# Patient Record
Sex: Male | Born: 1990 | Race: Black or African American | Hispanic: No | Marital: Single | Smoking: Current every day smoker
Health system: Southern US, Community
[De-identification: ages and names within clinical notes are randomized; demographics above are authoritative.]

## PROBLEM LIST (undated history)

## (undated) DIAGNOSIS — G43909 Migraine, unspecified, not intractable, without status migrainosus: Secondary | ICD-10-CM

---

## 2013-08-30 ENCOUNTER — Emergency Department: Payer: Self-pay | Admitting: Emergency Medicine

## 2013-08-30 LAB — CBC WITH DIFFERENTIAL/PLATELET
BASOS PCT: 0.3 %
Basophil #: 0 10*3/uL (ref 0.0–0.1)
Eosinophil #: 0.2 10*3/uL (ref 0.0–0.7)
Eosinophil %: 1.4 %
HCT: 47.6 % (ref 40.0–52.0)
HGB: 15.5 g/dL (ref 13.0–18.0)
Lymphocyte #: 4.2 10*3/uL — ABNORMAL HIGH (ref 1.0–3.6)
Lymphocyte %: 30.2 %
MCH: 30.9 pg (ref 26.0–34.0)
MCHC: 32.6 g/dL (ref 32.0–36.0)
MCV: 95 fL (ref 80–100)
MONO ABS: 1.1 x10 3/mm — AB (ref 0.2–1.0)
Monocyte %: 8 %
Neutrophil #: 8.4 10*3/uL — ABNORMAL HIGH (ref 1.4–6.5)
Neutrophil %: 60.1 %
Platelet: 316 10*3/uL (ref 150–440)
RBC: 5.03 10*6/uL (ref 4.40–5.90)
RDW: 14.2 % (ref 11.5–14.5)
WBC: 13.9 10*3/uL — AB (ref 3.8–10.6)

## 2013-08-30 LAB — COMPREHENSIVE METABOLIC PANEL
AST: 29 U/L (ref 15–37)
Albumin: 3.7 g/dL (ref 3.4–5.0)
Alkaline Phosphatase: 87 U/L
Anion Gap: 6 — ABNORMAL LOW (ref 7–16)
BILIRUBIN TOTAL: 0.5 mg/dL (ref 0.2–1.0)
BUN: 7 mg/dL (ref 7–18)
CALCIUM: 8.9 mg/dL (ref 8.5–10.1)
Chloride: 111 mmol/L — ABNORMAL HIGH (ref 98–107)
Co2: 23 mmol/L (ref 21–32)
Creatinine: 1.02 mg/dL (ref 0.60–1.30)
EGFR (African American): >60
GLUCOSE: 87 mg/dL (ref 65–99)
Osmolality: 277 (ref 275–301)
Potassium: 3.5 mmol/L (ref 3.5–5.1)
SGPT (ALT): 42 U/L
Sodium: 140 mmol/L (ref 136–145)
TOTAL PROTEIN: 7.6 g/dL (ref 6.4–8.2)

## 2013-08-30 LAB — URINALYSIS, COMPLETE
BILIRUBIN, UR: NEGATIVE
Bacteria: NONE SEEN
Blood: NEGATIVE
Glucose,UR: NEGATIVE mg/dL (ref 0–75)
Hyaline Cast: 2
KETONE: NEGATIVE
Leukocyte Esterase: NEGATIVE
Nitrite: NEGATIVE
Ph: 5 (ref 4.5–8.0)
Protein: NEGATIVE
RBC, UR: NONE SEEN /HPF (ref 0–5)
SPECIFIC GRAVITY: 1.028 (ref 1.003–1.030)
Squamous Epithelial: NONE SEEN
WBC UR: NONE SEEN /HPF (ref 0–5)

## 2013-08-30 LAB — LIPASE, BLOOD: Lipase: 230 U/L (ref 73–393)

## 2014-09-13 ENCOUNTER — Emergency Department
Admission: EM | Admit: 2014-09-13 | Discharge: 2014-09-13 | Disposition: A | Discharge status: Home / Self Care | Payer: Self-pay | Attending: Emergency Medicine | Admitting: Emergency Medicine

## 2014-09-13 DIAGNOSIS — H6502 Acute serous otitis media, left ear: Secondary | ICD-10-CM | POA: Insufficient documentation

## 2014-09-13 MED ORDER — IBUPROFEN 800 MG PO TABS: 800.0000 mg | ORAL_TABLET | Freq: Three times a day (TID) | ORAL | Status: DC | PRN

## 2014-09-13 MED ORDER — AMOXICILLIN 500 MG PO TABS: 500.0000 mg | ORAL_TABLET | Freq: Two times a day (BID) | ORAL | Status: DC

## 2014-09-13 MED ORDER — HYDROCODONE-ACETAMINOPHEN 5-325 MG PO TABS: 1.0000 | ORAL_TABLET | ORAL | Status: DC | PRN

## 2014-09-13 NOTE — ED Notes (Signed)
Pt to ED c/o L sided earache and headache x 2 days.

## 2014-09-13 NOTE — ED Provider Notes (Signed)
[facility] Emergency Department Provider Note  ____________________________________________  Time seen: Approximately 7:17 PM  I have reviewed the triage vital signs and the nursing notes.   HISTORY  Chief Complaint Headache and Otalgia    HPI [name] is a 24 y.o. male presents with complaints of left-sided earache and headache for 2 days. Denies any trauma. Denies any fever chills nausea vomiting. Blood pressure noted to be elevated systolically.   No past medical history on file.  There are no active problems to display for this patient.   No past surgical history on file.  Current Outpatient Rx  Name  Route  Sig  Dispense  Refill  . amoxicillin (AMOXIL) 500 MG tablet   Oral   Take 1 tablet (500 mg total) by mouth 2 (two) times daily.   20 tablet   0   . HYDROcodone-acetaminophen (NORCO) 5-325 MG per tablet   Oral   Take 1-2 tablets by mouth every 4 (four) hours as needed for moderate pain.   15 tablet   0   . ibuprofen (ADVIL,MOTRIN) 800 MG tablet   Oral   Take 1 tablet (800 mg total) by mouth every 8 (eight) hours as needed.   30 tablet   0     Allergies Review of patient's allergies indicates no known allergies.  No family history on file.  Social History Social History  Substance Use Topics  . Smoking status: Not on file  . Smokeless tobacco: Not on file  . Alcohol Use: Not on file    Review of Systems Constitutional: No fever/chills Eyes: No visual changes. ENT: No sore throat. Positive for left earache and headache Cardiovascular: Denies chest pain. Respiratory: Denies shortness of breath. Gastrointestinal: No abdominal pain.  No nausea, no vomiting.  No diarrhea.  No constipation. Genitourinary: Negative for dysuria. Musculoskeletal: Negative for back pain. Skin: Negative for rash. Neurological: Positive for headaches, negative for focal weakness or numbness.  10-point ROS otherwise  negative.  ____________________________________________   PHYSICAL EXAM:  VITAL SIGNS: ED Triage Vitals  Enc Vitals Group     BP 09/13/14 1914 160/78 mmHg     Pulse Rate 09/13/14 1914 82     Resp 09/13/14 1914 20     Temp 09/13/14 1914 98.4 F (36.9 C)     Temp Source 09/13/14 1914 Oral     SpO2 09/13/14 1914 99 %     Weight 09/13/14 1914 195 lb (88.451 kg)     Height 09/13/14 1914 6' (1.829 m)     Head Cir --      Peak Flow --      Pain Score 09/13/14 1914 8     Pain Loc --      Pain Edu? --      Excl. in GC? --     Constitutional: Alert and oriented. Well appearing and in no acute distress. Eyes: Conjunctivae are normal. PERRL. EOMI. Head: Atraumatic. Positive otitis media with erythema noted to left TM. Nose: No congestion/rhinnorhea. Mouth/Throat: Mucous membranes are moist.  Oropharynx non-erythematous. Neck: No stridor.   Cardiovascular: Normal rate, regular rhythm. Grossly normal heart sounds.  Good peripheral circulation. Respiratory: Normal respiratory effort.  No retractions. Lungs CTAB. Musculoskeletal: No lower extremity tenderness nor edema.  No joint effusions. Neurologic:  Normal speech and language. No gross focal neurologic deficits are appreciated. No gait instability. Skin:  Skin is warm, dry and intact. No rash noted. Psychiatric: Mood and affect are normal. Speech and behavior  are normal.  ____________________________________________   LABS (all labs ordered are listed, but only abnormal results are displayed)  Labs Reviewed - No data to display ____________________________________________  PROCEDURES  Procedure(s) performed: None  Critical Care performed: No  ____________________________________________   INITIAL IMPRESSION / ASSESSMENT AND PLAN / ED COURSE  Pertinent labs & imaging results that were available during my care of the patient were reviewed by me and considered in my medical decision making (see chart for  details).  Acute left otitis media. Rx given for Amoxil 500 mg 3 times a day Motrin 800 mg 3 times a day and hydrocodone No. 8 for severe pain. Patient follow-up with PCP or return to the ED as needed.  Patient voices no other emergency medical complaints at this time. ____________________________________________   FINAL CLINICAL IMPRESSION(S) / ED DIAGNOSES  Final diagnoses:  None      Evangeline Dakin, PA-C 09/13/14 1938  Phineas Semen, MD 09/13/14 2142

## 2014-09-21 ENCOUNTER — Emergency Department
Admission: EM | Admit: 2014-09-21 | Discharge: 2014-09-21 | Disposition: A | Discharge status: Home / Self Care | Payer: Self-pay | Attending: Emergency Medicine | Admitting: Emergency Medicine

## 2014-09-21 ENCOUNTER — Encounter: Payer: Self-pay | Admitting: Emergency Medicine

## 2014-09-21 ENCOUNTER — Emergency Department: Payer: Self-pay

## 2014-09-21 DIAGNOSIS — Z720 Tobacco use: Secondary | ICD-10-CM | POA: Insufficient documentation

## 2014-09-21 DIAGNOSIS — G44209 Tension-type headache, unspecified, not intractable: Secondary | ICD-10-CM | POA: Insufficient documentation

## 2014-09-21 MED ORDER — BUTALBITAL-APAP-CAFFEINE 50-325-40 MG PO TABS
2.0000 | ORAL_TABLET | Freq: Once | ORAL | Status: AC
  Administered 2014-09-21: 2 via ORAL
  Filled 2014-09-21: qty 2

## 2014-09-21 MED ORDER — BUTALBITAL-APAP-CAFFEINE 50-325-40 MG PO TABS: 1.0000 | ORAL_TABLET | Freq: Four times a day (QID) | ORAL | Status: AC | PRN

## 2014-09-21 NOTE — ED Provider Notes (Signed)
[facility] Emergency Department Provider Note ____________________________________________  Time seen: Approximately 1:05 PM  I have reviewed the triage vital signs and the nursing notes.   HISTORY  Chief Complaint Headache   HPI [name] is a 24 y.o. male who presents to the emergency department for evaluation of a headache. He states the headache is been present since 09/13/2014. He was seen here that day and told that he had an ear infection. He states that the ear pain has resolved, but the headache on the left side continues.  Location: Left frontal Similar to previous headaches: No Duration: 09/13/2014 TIMING: Unable to recall the specific time of onset SEVERITY: 9/10 QUALITY: Band like CONTEXT: No known exposures, no recent illness outside of the ear infection MODIFYING FACTORS: None ASSOCIATED SYMPTOMS: None History reviewed. No pertinent past medical history.  There are no active problems to display for this patient.   History reviewed. No pertinent past surgical history.  Current Outpatient Rx  Name  Route  Sig  Dispense  Refill  . amoxicillin (AMOXIL) 500 MG tablet   Oral   Take 1 tablet (500 mg total) by mouth 2 (two) times daily.   20 tablet   0   . HYDROcodone-acetaminophen (NORCO) 5-325 MG per tablet   Oral   Take 1-2 tablets by mouth every 4 (four) hours as needed for moderate pain.   15 tablet   0   . ibuprofen (ADVIL,MOTRIN) 800 MG tablet   Oral   Take 1 tablet (800 mg total) by mouth every 8 (eight) hours as needed.   30 tablet   0   . butalbital-acetaminophen-caffeine (FIORICET) 50-325-40 MG per tablet   Oral   Take 1-2 tablets by mouth every 6 (six) hours as needed for headache.   20 tablet   0     Allergies Review of patient's allergies indicates no known allergies.  History reviewed. No pertinent family history.  Social History Social History  Substance Use Topics  . Smoking status: Current  Every Day Smoker  . Smokeless tobacco: None  . Alcohol Use: Yes    Review of Systems Constitutional: No fever/chills Eyes: No visual changes. ENT: No sore throat. Cardiovascular: Denies chest pain. Respiratory: Denies shortness of breath. Gastrointestinal: No abdominal pain.  No nausea, no vomiting.  No diarrhea.  No constipation. Genitourinary: Negative for dysuria or incontinence. Musculoskeletal: Negative for pain. Skin: Negative for rash. Neurological:Positive for headache , negative for focal weakness or numbness. No confusion or fainting. Psychiatric:No anxiety or depression  10-point ROS otherwise negative.  ____________________________________________   PHYSICAL EXAM:  VITAL SIGNS: ED Triage Vitals  Enc Vitals Group     BP 09/21/14 1209 142/86 mmHg     Pulse Rate 09/21/14 1209 60     Resp 09/21/14 1209 20     Temp 09/21/14 1209 97.9 F (36.6 C)     Temp Source 09/21/14 1209 Oral     SpO2 09/21/14 1209 99 %     Weight 09/21/14 1209 195 lb (88.451 kg)     Height 09/21/14 1209 6' (1.829 m)     Head Cir --      Peak Flow --      Pain Score 09/21/14 1210 10     Pain Loc --      Pain Edu? --      Excl. in GC? --     Constitutional: Alert and oriented. Well appearing and in no acute distress. Eyes: Conjunctivae are normal. PERRL.  EOMI. No evidence of papilledema on limited exam. Head: Atraumatic. Nose: No congestion/rhinnorhea. Mouth/Throat: Mucous membranes are moist.  Oropharynx non-erythematous. Neck: No stridor. No meningismus.   Cardiovascular: Normal rate, regular rhythm. Grossly normal heart sounds.  Good peripheral circulation. Respiratory: Normal respiratory effort.  No retractions. Lungs CTAB. Gastrointestinal: Soft and nontender. No distention. No abdominal bruits. No CVA tenderness. Musculoskeletal: No lower extremity tenderness nor edema.  No joint effusions. Neurologic:  Normal speech and language. No gross focal neurologic deficits are  appreciated. No gait instability.  Cranial nerves: 2-10 normal as tested.  Cerebellar: Normal Romberg, finger-nose-finger, normal gait. Sensorimotor: No aphasia, pronator drift, clonus, sensory loss or abnormal reflexes.  Skin:  Skin is warm, dry and intact. No rash noted. Psychiatric: Mood and affect are normal. Speech and behavior are normal. Normal thought process and cognition.  ____________________________________________   LABS (all labs ordered are listed, but only abnormal results are displayed)  Labs Reviewed - No data to display ____________________________________________  EKG   ____________________________________________  RADIOLOGY  CT negative for acute findings. ____________________________________________   PROCEDURES  Procedure(s) performed: None  Critical Care performed: No  ____________________________________________   INITIAL IMPRESSION / ASSESSMENT AND PLAN / ED COURSE  Pertinent labs & imaging results that were available during my care of the patient were reviewed by me and considered in my medical decision making (see chart for details).  Patient reports improvement after taking Fioricet.  Patient was advised to follow up with the primary care provider for symptoms that are not relieved or improved over the next 24 hours. Also advised to return to the emergency department for symptoms that change or worsen if unable to schedule an appointment. ____________________________________________   FINAL CLINICAL IMPRESSION(S) / ED DIAGNOSES  Final diagnoses:  Tension-type headache, not intractable, unspecified chronicity pattern     Chinita Pester, FNP 09/21/14 1310  Emily Filbert, MD 09/21/14 [phone]

## 2014-09-21 NOTE — ED Notes (Signed)
States he was seen last week for headache/ear pain was dx'd with ear infection placed on antibiotics and norco. States ear pain is better but conts to have pain at left temple area   No fever or n/v or vision changes

## 2014-09-21 NOTE — Discharge Instructions (Signed)
Tension Headache °A tension headache is pain, pressure, or aching felt over the front and sides of the head. Tension headaches often come after stress, feeling worried (anxiety), or feeling sad or down for a while (depressed). °HOME CARE °· Only take medicine as told by your doctor. °· Lie down in a dark, quiet room when you have a headache. °· Keep a journal to find out if certain things bring on headaches. For example, write down: °¨ What you eat and drink. °¨ How much sleep you get. °¨ Any change to your diet or medicines. °· Relax by getting a massage or doing other relaxing activities. °· Put ice or heat packs on the head and neck area as told by your doctor. °· Lessen stress. °· Sit up straight. Do not tighten (tense) your muscles. °· Quit smoking if you smoke. °· Lessen how much alcohol you drink. °· Lessen how much caffeine you drink, or stop drinking caffeine. °· Eat and exercise regularly. °· Get enough sleep. °· Avoid using too much pain medicine. °GET HELP RIGHT AWAY IF:  °· Your headache becomes really bad. °· You have a fever. °· You have a stiff neck. °· You have trouble seeing. °· Your muscles are weak, or you lose muscle control. °· You lose your balance or have trouble walking. °· You feel like you will pass out (faint), or you pass out. °· You have really bad symptoms that are different than your first symptoms. °· You have problems with the medicines given to you by your doctor. °· Your medicines do not work. °· Your headache feels different than the other headaches. °· You feel sick to your stomach (nauseous) or throw up (vomit). °MAKE SURE YOU:  °· Understand these instructions. °· Will watch your condition. °· Will get help right away if you are not doing well or get worse. °Document Released: 04/08/2009 Document Revised: 04/06/2011 Document Reviewed: 01/02/2011 °[employer]® Patient Information ©2015 [employer]. This information is not intended to replace advice given to you by your health  care provider. Make sure you discuss any questions you have with your health care provider. ° °

## 2014-09-21 NOTE — ED Notes (Signed)
Pt to ed with c/o headache since August 18th.  States he was seen for earache on that day and now the ear feels better but the headache has continued.

## 2014-11-06 ENCOUNTER — Encounter: Payer: Self-pay | Admitting: Urgent Care

## 2014-11-06 ENCOUNTER — Emergency Department
Admission: EM | Admit: 2014-11-06 | Discharge: 2014-11-06 | Disposition: A | Discharge status: Home / Self Care | Payer: Self-pay | Attending: Emergency Medicine | Admitting: Emergency Medicine

## 2014-11-06 DIAGNOSIS — R51 Headache: Secondary | ICD-10-CM

## 2014-11-06 DIAGNOSIS — G43909 Migraine, unspecified, not intractable, without status migrainosus: Secondary | ICD-10-CM | POA: Insufficient documentation

## 2014-11-06 DIAGNOSIS — Z720 Tobacco use: Secondary | ICD-10-CM | POA: Insufficient documentation

## 2014-11-06 DIAGNOSIS — R519 Headache, unspecified: Secondary | ICD-10-CM

## 2014-11-06 DIAGNOSIS — G8929 Other chronic pain: Secondary | ICD-10-CM | POA: Insufficient documentation

## 2014-11-06 HISTORY — DX: Migraine, unspecified, not intractable, without status migrainosus: G43.909

## 2014-11-06 MED ORDER — METOCLOPRAMIDE HCL 5 MG/ML IJ SOLN
10.0000 mg | Freq: Once | INTRAMUSCULAR | Status: AC
  Administered 2014-11-06: 10 mg via INTRAVENOUS
  Filled 2014-11-06: qty 2

## 2014-11-06 MED ORDER — DEXAMETHASONE SODIUM PHOSPHATE 10 MG/ML IJ SOLN
10.0000 mg | Freq: Once | INTRAMUSCULAR | Status: AC
  Administered 2014-11-06: 10 mg via INTRAVENOUS
  Filled 2014-11-06: qty 1

## 2014-11-06 MED ORDER — DIPHENHYDRAMINE HCL 50 MG/ML IJ SOLN
25.0000 mg | Freq: Once | INTRAMUSCULAR | Status: AC
  Administered 2014-11-06: 25 mg via INTRAVENOUS
  Filled 2014-11-06: qty 1

## 2014-11-06 MED ORDER — KETOROLAC TROMETHAMINE 30 MG/ML IJ SOLN
30.0000 mg | Freq: Once | INTRAMUSCULAR | Status: AC
  Administered 2014-11-06: 30 mg via INTRAVENOUS
  Filled 2014-11-06: qty 1

## 2014-11-06 MED ORDER — MAGNESIUM SULFATE 2 GM/50ML IV SOLN
2.0000 g | Freq: Once | INTRAVENOUS | Status: AC
  Administered 2014-11-06: 2 g via INTRAVENOUS
  Filled 2014-11-06: qty 50

## 2014-11-06 MED ORDER — SODIUM CHLORIDE 0.9 % IV BOLUS (SEPSIS)
1000.0000 mL | INTRAVENOUS | Status: AC
  Administered 2014-11-06: 1000 mL via INTRAVENOUS

## 2014-11-06 NOTE — ED Notes (Signed)
Patient presents with c/o a headache to his LEFT temple area x 2 months. Patient advising that he has been here before for the same. Denies N/V and nuchal rigidity. Has been using Rx'd meds and IBU without relief. Reports issues with sleep secondary to pain.

## 2014-11-06 NOTE — Discharge Instructions (Signed)
You have been seen in the Emergency Department (ED) for a headache.  Please use Tylenol or Motrin as needed for symptoms, but only as written on the box.  ° °As we have discussed, please follow up with your primary care doctor as soon as possible regarding today’s Emergency Department (ED) visit and your headache symptoms.   ° °Call your doctor or return to the ED if you have a worsening headache, sudden and severe headache, confusion, slurred speech, facial droop, weakness or numbness in any arm or leg, extreme fatigue, vision problems, or other symptoms that concern you. ° ° °General Headache Without Cause °A headache is pain or discomfort felt around the head or neck area. The specific cause of a headache may not be found. There are many causes and types of headaches. A few common ones are: °· Tension headaches. °· Migraine headaches. °· Cluster headaches. °· Chronic daily headaches. °HOME CARE INSTRUCTIONS  °Watch your condition for any changes. Take these steps to help with your condition: °Managing Pain °· Take over-the-counter and prescription medicines only as told by your health care provider. °· Lie down in a dark, quiet room when you have a headache. °· If directed, apply ice to the head and neck area: °¨ Put ice in a plastic bag. °¨ Place a towel between your skin and the bag. °¨ Leave the ice on for 20 minutes, 2-3 times per day. °· Use a heating pad or hot shower to apply heat to the head and neck area as told by your health care provider. °· Keep lights dim if bright lights bother you or make your headaches worse. °Eating and Drinking °· Eat meals on a regular schedule. °· Limit alcohol use. °· Decrease the amount of caffeine you drink, or stop drinking caffeine. °General Instructions °· Keep all follow-up visits as told by your health care provider. This is important. °· Keep a headache journal to help find out what may trigger your headaches. For example, write down: °¨ What you eat and  drink. °¨ How much sleep you get. °¨ Any change to your diet or medicines. °· Try massage or other relaxation techniques. °· Limit stress. °· Sit up straight, and do not tense your muscles. °· Do not use tobacco products, including cigarettes, chewing tobacco, or e-cigarettes. If you need help quitting, ask your health care provider. °· Exercise regularly as told by your health care provider. °· Sleep on a regular schedule. Get 7-9 hours of sleep, or the amount recommended by your health care provider. °SEEK MEDICAL CARE IF:  °· Your symptoms are not helped by medicine. °· You have a headache that is different from the usual headache. °· You have nausea or you vomit. °· You have a fever. °SEEK IMMEDIATE MEDICAL CARE IF:  °· Your headache becomes severe. °· You have repeated vomiting. °· You have a stiff neck. °· You have a loss of vision. °· You have problems with speech. °· You have pain in the eye or ear. °· You have muscular weakness or loss of muscle control. °· You lose your balance or have trouble walking. °· You feel faint or pass out. °· You have confusion. °  °This information is not intended to replace advice given to you by your health care provider. Make sure you discuss any questions you have with your health care provider. °  °Document Released: 01/12/2005 Document Revised: 10/03/2014 Document Reviewed: 05/07/2014 °Elsevier Interactive Patient Education ©2016 [employer] ° °

## 2014-11-06 NOTE — ED Provider Notes (Signed)
[facility] Emergency Department Provider Note  ____________________________________________  Time seen: Approximately 9:14 PM  I have reviewed the triage vital signs and the nursing notes.   HISTORY  Chief Complaint Migraine    HPI [name] is a 24 y.o. male with no significant past medical history except for chronic migraines who presents with a migraine 2 months.  He states that it is worse in the left side of his head.  It comes and goes but always returns.  Nothing makes it better and nothing makes it worse.  He denies nausea and vomiting.  He and his wife state that he does not snore and he is not obese which would suggest sleep apnea.  He does state that sometimes the headache is worse during the night.  He was seen about 2 months ago in the emergency department and discharged with Fioricet after reassuring examination and normal CT scan.  Nothing is changed during that time and he has not followed up with any outpatient doctors.  He denies fever/chills, vision changes, chest pain, shortness of breath, abdominal pain.he states the symptoms range in intensity for mild to severe.  They are currently moderate.   Past Medical History  Diagnosis Date  . Migraine     There are no active problems to display for this patient.   History reviewed. No pertinent past surgical history.  Current Outpatient Rx  Name  Route  Sig  Dispense  Refill  . butalbital-acetaminophen-caffeine (FIORICET) 50-325-40 MG per tablet   Oral   Take 1-2 tablets by mouth every 6 (six) hours as needed for headache.   20 tablet   0   . ibuprofen (ADVIL,MOTRIN) 800 MG tablet   Oral   Take 1 tablet (800 mg total) by mouth every 8 (eight) hours as needed.   30 tablet   0     Allergies Review of patient's allergies indicates no known allergies.  No family history on file.  Social History Social History  Substance Use Topics  . Smoking status: Current Every Day  Smoker  . Smokeless tobacco: None  . Alcohol Use: Yes    Review of Systems Constitutional: No fever/chills Eyes: No visual changes. ENT: No sore throat. Cardiovascular: Denies chest pain. Respiratory: Denies shortness of breath. Gastrointestinal: No abdominal pain.  No nausea, no vomiting.  No diarrhea.  No constipation. Genitourinary: Negative for dysuria. Musculoskeletal: Negative for back pain. Skin: Negative for rash. Neurological: intermittent, waxing and waning but persistent headache 2 months.  10-point ROS otherwise negative.  ____________________________________________   PHYSICAL EXAM:  VITAL SIGNS: ED Triage Vitals  Enc Vitals Group     BP 11/06/14 2003 151/87 mmHg     Pulse Rate 11/06/14 2003 82     Resp 11/06/14 2003 18     Temp 11/06/14 2003 98.8 F (37.1 C)     Temp Source 11/06/14 2003 Oral     SpO2 11/06/14 2003 99 %     Weight 11/06/14 2003 205 lb (92.987 kg)     Height 11/06/14 2003 6' (1.829 m)     Head Cir --      Peak Flow --      Pain Score 11/06/14 2003 8     Pain Loc --      Pain Edu? --      Excl. in GC? --     Constitutional: Alert and oriented. Well appearing and in no acute distress. Eyes: Conjunctivae are normal. PERRL. EOMI. funduscopic exam is  normal with no papilledema Head: Atraumatic. Nose: No congestion/rhinnorhea. Mouth/Throat: Mucous membranes are moist.  Oropharynx non-erythematous. Neck: No stridor.  No meningismus. Cardiovascular: Normal rate, regular rhythm. Grossly normal heart sounds.  Good peripheral circulation. Respiratory: Normal respiratory effort.  No retractions. Lungs CTAB. Gastrointestinal: Soft and nontender. No distention. No abdominal bruits. No CVA tenderness. Musculoskeletal: No lower extremity tenderness nor edema.  No joint effusions. Neurologic:  Normal speech and language. No gross focal neurologic deficits are appreciated.  Skin:  Skin is warm, dry and intact. No rash noted. Psychiatric: Mood and  affect are normal. Speech and behavior are normal.  ____________________________________________   LABS (all labs ordered are listed, but only abnormal results are displayed)  Labs Reviewed - No data to display ____________________________________________  EKG  Not indicated ____________________________________________  RADIOLOGY   No results found.  ____________________________________________   PROCEDURES  Procedure(s) performed: None  Critical Care performed: No ____________________________________________   INITIAL IMPRESSION / ASSESSMENT AND PLAN / ED COURSE  Pertinent labs & imaging results that were available during my care of the patient were reviewed by me and considered in my medical decision making (see chart for details).  The patient is well-appearing and in no acute distress with normal vital signs.  His headache symptoms are not consistent with any specific etiology.  I discussed with him the need for outpatient follow-up.  There is no indication for additional imaging at this time.  I will treat him empirically as a migraine to see if his symptoms improve.  I do not believe he has any emergent medical condition at this time.  ----------------------------------------- 10:59 PM on 11/06/2014 -----------------------------------------  The patient is sleeping comfortably.  His headache improved prior to going to sleep according to his girlfriend.  I will discharge him for outpatient follow-up.  I discussed this plan with his girlfriend.  ____________________________________________  FINAL CLINICAL IMPRESSION(S) / ED DIAGNOSES  Final diagnoses:  Chronic nonintractable headache, unspecified headache type      NEW MEDICATIONS STARTED DURING THIS VISIT:  New Prescriptions   No medications on file     Loleta Rose, MD 11/06/14 2259

## 2014-12-04 ENCOUNTER — Emergency Department
Admission: EM | Admit: 2014-12-04 | Discharge: 2014-12-04 | Discharge status: Against Medical Advice | Payer: Self-pay | Attending: Emergency Medicine | Admitting: Emergency Medicine

## 2014-12-04 DIAGNOSIS — R51 Headache: Secondary | ICD-10-CM | POA: Insufficient documentation

## 2014-12-04 NOTE — ED Notes (Signed)
Patient with headache that started yesterday.  Patient verbalized that he has been here multiple times for migraines. Patient is alert and oriented, with no neurological deficit.

## 2014-12-08 ENCOUNTER — Encounter: Payer: Self-pay | Admitting: Emergency Medicine

## 2014-12-08 ENCOUNTER — Emergency Department
Admission: EM | Admit: 2014-12-08 | Discharge: 2014-12-08 | Disposition: A | Discharge status: Home / Self Care | Payer: Self-pay | Attending: Emergency Medicine | Admitting: Emergency Medicine

## 2014-12-08 DIAGNOSIS — Z87891 Personal history of nicotine dependence: Secondary | ICD-10-CM | POA: Insufficient documentation

## 2014-12-08 DIAGNOSIS — G8929 Other chronic pain: Secondary | ICD-10-CM | POA: Insufficient documentation

## 2014-12-08 DIAGNOSIS — R51 Headache: Secondary | ICD-10-CM | POA: Insufficient documentation

## 2014-12-08 MED ORDER — BUTALBITAL-APAP-CAFFEINE 50-325-40 MG PO TABS
2.0000 | ORAL_TABLET | Freq: Once | ORAL | Status: AC
  Administered 2014-12-08: 2 via ORAL
  Filled 2014-12-08: qty 2

## 2014-12-08 MED ORDER — ISOMETHEPTENE-DICHLORAL-APAP 65-100-325 MG PO CAPS: 1.0000 | ORAL_CAPSULE | ORAL | Status: AC | PRN

## 2014-12-08 NOTE — ED Notes (Signed)
States history of migraines x 2 months, no head injury, states are L sided, states was seen previously and diagnosed with migraine however has not been able to follow up with neurology yet.

## 2014-12-08 NOTE — Discharge Instructions (Signed)
°  Keep your appointment with the neurologist in December.  Midrin as directed for headache one every 4 hours as needed for headache.

## 2014-12-08 NOTE — ED Provider Notes (Signed)
[facility] Emergency Department Provider Note  ____________________________________________  Time seen: Approximately 12:42 PM  I have reviewed the triage vital signs and the nursing notes.   HISTORY  Chief Complaint Headache   HPI [name] is a 24 y.o. male is here with complaint of left-sided headache. Patient states that he has had headaches which was diagnosed as "migraines" for the last 2 months. Patient denies any head injury. He states he was seen here some months ago and had a CT scan. He does have an appointment with a neurologist in December but today presents with headache and nothing at home to take for it. He denies any nausea or vomiting. He denies any photophobia or visual disturbances. He states this is his normal type headache and nothing is changed. Currently he states his pain is 7 out of 10 and headache is constant and non-throbbing and nonradiating.   Past Medical History  Diagnosis Date  . Migraine     There are no active problems to display for this patient.   History reviewed. No pertinent past surgical history.  Current Outpatient Rx  Name  Route  Sig  Dispense  Refill  . butalbital-acetaminophen-caffeine (FIORICET) 50-325-40 MG per tablet   Oral   Take 1-2 tablets by mouth every 6 (six) hours as needed for headache.   20 tablet   0   . ibuprofen (ADVIL,MOTRIN) 800 MG tablet   Oral   Take 1 tablet (800 mg total) by mouth every 8 (eight) hours as needed.   30 tablet   0   . isometheptene-acetaminophen-dichloralphenazone (MIDRIN) 65-100-325 MG capsule   Oral   Take 1 capsule by mouth every 4 (four) hours as needed for migraine. Maximum 5 capsules in 12 hours for migraine headaches, 8 capsules in 24 hours for tension headaches.   30 capsule   2     Allergies Review of patient's allergies indicates no known allergies.  No family history on file.  Social History Social History  Substance Use Topics  .  Smoking status: Former Smoker    Types: Cigarettes  . Smokeless tobacco: None  . Alcohol Use: Yes    Review of Systems Constitutional: No fever/chills Eyes: No visual changes. ENT: No sore throat. Cardiovascular: Denies chest pain. Respiratory: Denies shortness of breath. Gastrointestinal:   No nausea, no vomiting.  Genitourinary: Negative for dysuria. Musculoskeletal: Negative for back pain. Skin: Negative for rash. Neurological: Positive for headaches, no focal weakness or numbness.  10-point ROS otherwise negative.  ____________________________________________   PHYSICAL EXAM:  VITAL SIGNS: ED Triage Vitals  Enc Vitals Group     BP 12/08/14 1212 136/101 mmHg     Pulse Rate 12/08/14 1212 100     Resp 12/08/14 1212 18     Temp 12/08/14 1212 97.4 F (36.3 C)     Temp Source 12/08/14 1212 Oral     SpO2 12/08/14 1212 99 %     Weight 12/08/14 1212 200 lb (90.719 kg)     Height 12/08/14 1212 6' (1.829 m)     Head Cir --      Peak Flow --      Pain Score 12/08/14 1213 7     Pain Loc --      Pain Edu? --      Excl. in GC? --     Constitutional: Alert and oriented. Well appearing and in no acute distress. No photophobia and patient is sitting in the room with a family member.  Eyes: Conjunctivae are normal. PERRL. EOMI. Head: Atraumatic. Nose: No congestion/rhinnorhea. Neck: No stridor.  Supple Hematological/Lymphatic/Immunilogical: No cervical lymphadenopathy. Cardiovascular: Normal rate, regular rhythm. Grossly normal heart sounds.  Good peripheral circulation. Respiratory: Normal respiratory effort.  No retractions. Lungs CTAB. Gastrointestinal: Soft and nontender. No distention.  Musculoskeletal: No lower extremity tenderness nor edema.  No joint effusions. Neurologic:  Normal speech and language. No gross focal neurologic deficits are appreciated. Normal gait without instability. Radial nerves II through XII grossly intact. Skin:  Skin is warm, dry and intact. No  rash noted. Psychiatric: Mood and affect are normal. Speech and behavior are normal.  ____________________________________________   LABS (all labs ordered are listed, but only abnormal results are displayed)  Labs Reviewed - No data to display  PROCEDURES  Procedure(s) performed: None  Critical Care performed: No  ____________________________________________   INITIAL IMPRESSION / ASSESSMENT AND PLAN / ED COURSE  Pertinent labs & imaging results that were available during my care of the patient were reviewed by me and considered in my medical decision making (see chart for details).  CT from August of this year was reviewed. This was discussed with patient. Patient was given a prescription for Fioricet as pharmacist called and states that Midrin is extremely expensive. Patient is encouraged to keep his appointment with the neurologist in December. On exam today and history taking there appears to be no change in his headache pattern therefore CT of the head was not performed today. ____________________________________________   FINAL CLINICAL IMPRESSION(S) / ED DIAGNOSES  Final diagnoses:  Chronic left-sided headaches      Tommi Rumps, PA-C 12/08/14 1601  Tommi Rumps, PA-C 12/08/14 1602  Darien Ramus, MD 12/09/14 [phone]

## 2014-12-08 NOTE — ED Notes (Signed)
Pt denies visual changes or sensitivity to light. Denies numbness or tingling. Denies nausea and vomiting.

## 2016-01-25 ENCOUNTER — Emergency Department: Payer: Self-pay

## 2016-01-25 ENCOUNTER — Emergency Department
Admission: EM | Admit: 2016-01-25 | Discharge: 2016-01-25 | Disposition: A | Discharge status: Home / Self Care | Payer: Self-pay | Attending: Emergency Medicine | Admitting: Emergency Medicine

## 2016-01-25 ENCOUNTER — Encounter: Payer: Self-pay | Admitting: *Deleted

## 2016-01-25 DIAGNOSIS — Z87891 Personal history of nicotine dependence: Secondary | ICD-10-CM | POA: Insufficient documentation

## 2016-01-25 DIAGNOSIS — R52 Pain, unspecified: Secondary | ICD-10-CM

## 2016-01-25 DIAGNOSIS — Y929 Unspecified place or not applicable: Secondary | ICD-10-CM | POA: Insufficient documentation

## 2016-01-25 DIAGNOSIS — S52135A Nondisplaced fracture of neck of left radius, initial encounter for closed fracture: Secondary | ICD-10-CM | POA: Insufficient documentation

## 2016-01-25 DIAGNOSIS — Y999 Unspecified external cause status: Secondary | ICD-10-CM | POA: Insufficient documentation

## 2016-01-25 DIAGNOSIS — Y9389 Activity, other specified: Secondary | ICD-10-CM | POA: Insufficient documentation

## 2016-01-25 MED ORDER — TRAMADOL HCL 50 MG PO TABS
50.0000 mg | ORAL_TABLET | Freq: Once | ORAL | Status: AC
  Administered 2016-01-25: 50 mg via ORAL
  Filled 2016-01-25: qty 1

## 2016-01-25 MED ORDER — MELOXICAM 15 MG PO TABS: 15.0000 mg | ORAL_TABLET | Freq: Every day | ORAL | 0 refills | Status: AC
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
## 2016-01-30 ENCOUNTER — Encounter: Payer: Self-pay | Admitting: Intensive Care

## 2016-01-30 ENCOUNTER — Emergency Department
Admission: EM | Admit: 2016-01-30 | Discharge: 2016-01-31 | Disposition: A | Discharge status: Home / Self Care | Payer: Self-pay | Attending: Emergency Medicine | Admitting: Emergency Medicine

## 2016-01-30 DIAGNOSIS — K529 Noninfective gastroenteritis and colitis, unspecified: Secondary | ICD-10-CM | POA: Insufficient documentation

## 2016-01-30 DIAGNOSIS — F17210 Nicotine dependence, cigarettes, uncomplicated: Secondary | ICD-10-CM | POA: Insufficient documentation

## 2016-01-30 DIAGNOSIS — Z79899 Other long term (current) drug therapy: Secondary | ICD-10-CM | POA: Insufficient documentation

## 2016-01-30 DIAGNOSIS — Z791 Long term (current) use of non-steroidal anti-inflammatories (NSAID): Secondary | ICD-10-CM | POA: Insufficient documentation

## 2016-01-30 LAB — COMPREHENSIVE METABOLIC PANEL
ALBUMIN: 4.8 g/dL (ref 3.5–5.0)
ALT: 28 U/L (ref 17–63)
AST: 25 U/L (ref 15–41)
Alkaline Phosphatase: 63 U/L (ref 38–126)
Anion gap: 11 (ref 5–15)
BUN: 13 mg/dL (ref 6–20)
CHLORIDE: 101 mmol/L (ref 101–111)
CO2: 27 mmol/L (ref 22–32)
CREATININE: 1 mg/dL (ref 0.61–1.24)
Calcium: 10.2 mg/dL (ref 8.9–10.3)
GFR calc Af Amer: >60 mL/min (ref >60)
GFR calc non Af Amer: >60 mL/min (ref >60)
Glucose, Bld: 93 mg/dL (ref 65–99)
Potassium: 4 mmol/L (ref 3.5–5.1)
SODIUM: 139 mmol/L (ref 135–145)
Total Bilirubin: 1.4 mg/dL — ABNORMAL HIGH (ref 0.3–1.2)
Total Protein: 9.1 g/dL — ABNORMAL HIGH (ref 6.5–8.1)

## 2016-01-30 LAB — URINALYSIS, COMPLETE (UACMP) WITH MICROSCOPIC
BACTERIA UA: NONE SEEN
Bilirubin Urine: NEGATIVE
Glucose, UA: NEGATIVE mg/dL
Hgb urine dipstick: NEGATIVE
KETONES UR: 80 mg/dL — AB
Leukocytes, UA: NEGATIVE
Nitrite: NEGATIVE
PH: 5 (ref 5.0–8.0)
Protein, ur: 30 mg/dL — AB
SQUAMOUS EPITHELIAL / LPF: NONE SEEN
Specific Gravity, Urine: 1.031 — ABNORMAL HIGH (ref 1.005–1.030)

## 2016-01-30 LAB — CBC
HEMATOCRIT: 50.4 % (ref 40.0–52.0)
Hemoglobin: 17.2 g/dL (ref 13.0–18.0)
MCH: 31.9 pg (ref 26.0–34.0)
MCHC: 34.2 g/dL (ref 32.0–36.0)
MCV: 93.4 fL (ref 80.0–100.0)
PLATELETS: 335 10*3/uL (ref 150–440)
RBC: 5.4 MIL/uL (ref 4.40–5.90)
RDW: 13.7 % (ref 11.5–14.5)
WBC: 12.9 10*3/uL — ABNORMAL HIGH (ref 3.8–10.6)

## 2016-01-30 LAB — LIPASE, BLOOD: LIPASE: 25 U/L (ref 11–51)

## 2016-01-30 MED ORDER — SODIUM CHLORIDE 0.9 % IV BOLUS (SEPSIS)
1000.0000 mL | Freq: Once | INTRAVENOUS | Status: AC
  Administered 2016-01-30: 1000 mL via INTRAVENOUS

## 2016-01-30 MED ORDER — ONDANSETRON HCL 4 MG PO TABS: 4.0000 mg | ORAL_TABLET | Freq: Every day | ORAL | 0 refills | Status: DC | PRN

## 2016-01-30 MED ORDER — SODIUM CHLORIDE 0.9 % IV BOLUS (SEPSIS): 1000.0000 mL | Freq: Once | INTRAVENOUS | Status: DC

## 2016-01-30 MED ORDER — ONDANSETRON HCL 4 MG/2ML IJ SOLN
4.0000 mg | Freq: Once | INTRAMUSCULAR | Status: AC
  Administered 2016-01-30: 4 mg via INTRAVENOUS
  Filled 2016-01-30: qty 2

## 2016-01-30 NOTE — ED Provider Notes (Signed)
-----------------------------------------   11:18 PM on 01/30/2016 -----------------------------------------   Blood pressure (!) 143/90, pulse (!) 102, temperature 97.7 F (36.5 C), temperature source Oral, resp. rate 18, height 6' (1.829 m), weight 93 kg, SpO2 100 %.  Assuming care from Dr. Alphonzo LemmingsMcShane.  In short, [name] is a 26 y.o. male with a chief complaint of Abdominal Pain .  Refer to the original H&P for additional details.  The current plan of care is to reassess abdominal discomfort after fluids and discharge if appropriate.   ----------------------------------------- 11:52 PM on 01/30/2016 -----------------------------------------  I reassessed the patient.  He is comfortable, laughing and joking with me, in no acute distress.  His vital signs have stabilized with no tachycardia at rest.  He states that his stomach feels better although he still has the occasional cramping pain.  I palpated his abdomen and he has no tenderness to palpation, no guarding, no rebound.  I had my usual and customary discussion about viral gastroenteritis and provided return precautions.  He understands and agrees with the plan.     Loleta Roseory Jennesis Ramaswamy, MD 01/30/16 [phone]

## 2016-01-30 NOTE — ED Provider Notes (Addendum)
[facility] Emergency Department Provider Note  ____________________________________________   I have reviewed the triage vital signs and the nursing notes.   HISTORY  Chief Complaint Abdominal Pain    HPI [name] is a 26 y.o. male who presents today complaining of vomiting and somewhat loose stools. Has had symptoms for 3 days. Denies fever or chills. Denies hematemesis bright blood per rectum or melena. He states that he has minimal discomfort but only when he vomits denies any focal abdominal pain, denies any recent travel.     Past Medical History:  Diagnosis Date  . Migraine     There are no active problems to display for this patient.   History reviewed. No pertinent surgical history.  Prior to Admission medications   Medication Sig Start Date End Date Taking? Authorizing Provider  ibuprofen (ADVIL,MOTRIN) 800 MG tablet Take 1 tablet (800 mg total) by mouth every 8 (eight) hours as needed. 09/13/14   Evangeline Dakinharles M Beers, PA-C  isometheptene-acetaminophen-dichloralphenazone (MIDRIN) [id number] MG capsule Take 1 capsule by mouth every 4 (four) hours as needed for migraine. Maximum 5 capsules in 12 hours for migraine headaches, 8 capsules in 24 hours for tension headaches. 12/08/14 12/08/15  Tommi Rumpshonda L Summers, PA-C  meloxicam (MOBIC) 15 MG tablet Take 1 tablet (15 mg total) by mouth daily. 01/25/16 02/04/16  Enid DerryAshley Wagner, PA-C    Allergies Patient has no known allergies.  History reviewed. No pertinent family history.  Social History Social History  Substance Use Topics  . Smoking status: Current Every Day Smoker    Types: Cigarettes  . Smokeless tobacco: Never Used  . Alcohol use No     Comment: rarely    Review of Systems Constitutional: No fever/chills Eyes: No visual changes. ENT: No sore throat. No stiff neck no neck pain Cardiovascular: Denies chest pain. Respiratory: Denies shortness of breath. Gastrointestinal:  The history of present illness Genitourinary: Negative for dysuria. Musculoskeletal: Negative lower extremity swelling Skin: Negative for rash. Neurological: Negative for severe headaches, focal weakness or numbness. 10-point ROS otherwise negative.  ____________________________________________   PHYSICAL EXAM:  VITAL SIGNS: ED Triage Vitals  Enc Vitals Group     BP 01/30/16 1727 (!) 143/90     Pulse Rate 01/30/16 1727 (!) 102     Resp 01/30/16 1727 18     Temp 01/30/16 1727 97.7 F (36.5 C)     Temp Source 01/30/16 1727 Oral     SpO2 01/30/16 1727 100 %     Weight 01/30/16 1727 205 lb (93 kg)     Height 01/30/16 1727 6' (1.829 m)     Head Circumference --      Peak Flow --      Pain Score 01/30/16 1733 8     Pain Loc --      Pain Edu? --      Excl. in GC? --     Constitutional: Alert and oriented. Well appearing and in no acute distress. Eyes: Conjunctivae are normal. PERRL. EOMI. Head: Atraumatic. Nose: No congestion/rhinnorhea. Mouth/Throat: Mucous membranes are moist.  Oropharynx non-erythematous. Neck: No stridor.   Nontender with no meningismus Cardiovascular: Normal rate, regular rhythm. Grossly normal heart sounds.  Good peripheral circulation. Respiratory: Normal respiratory effort.  No retractions. Lungs CTAB. Abdominal: Soft and nontender. No distention. No guarding no rebound Back:  There is no focal tenderness or step off.  there is no midline tenderness there are no lesions noted. there is no CVA tenderness  Musculoskeletal: No lower extremity tenderness, no upper extremity tenderness. No joint effusions, no DVT signs strong distal pulses no edema Neurologic:  Normal speech and language. No gross focal neurologic deficits are appreciated.  Skin:  Skin is warm, dry and intact. No rash noted. Psychiatric: Mood and affect are normal. Speech and behavior are normal.  ____________________________________________   LABS (all labs ordered are listed, but  only abnormal results are displayed)  Labs Reviewed  COMPREHENSIVE METABOLIC PANEL - Abnormal; Notable for the following:       Result Value   Total Protein 9.1 (*)    Total Bilirubin 1.4 (*)    All other components within normal limits  CBC - Abnormal; Notable for the following:    WBC 12.9 (*)    All other components within normal limits  URINALYSIS, COMPLETE (UACMP) WITH MICROSCOPIC - Abnormal; Notable for the following:    Color, Urine AMBER (*)    APPearance CLEAR (*)    Specific Gravity, Urine 1.031 (*)    Ketones, ur 80 (*)    Protein, ur 30 (*)    All other components within normal limits  LIPASE, BLOOD   ____________________________________________  EKG  I personally interpreted any EKGs ordered by me or triage  ____________________________________________  RADIOLOGY  I reviewed any imaging ordered by me or triage that were performed during my shift and, if possible, patient and/or family made aware of any abnormal findings. ____________________________________________   PROCEDURES  Procedure(s) performed: None  Procedures  Critical Care performed: None  ____________________________________________   INITIAL IMPRESSION / ASSESSMENT AND PLAN / ED COURSE  Pertinent labs & imaging results that were available during my care of the patient were reviewed by me and considered in my medical decision making (see chart for details).  Very well-appearing male with vomiting and Loose stools. Abdomen is benign. Blood work is reassuring. Does appear mildly dehydrated. We'll give him IV fluids and antiemetics and hopefully we can get him feeling better. Very large community burden of similar pathology with a norovirus predominance   ----------------------------------------- 11:08 PM on 01/30/2016 -----------------------------------------  Patient beginning to tolerate by mouth on his own in the room. Giving IV fluid. Feeling much better. Abdomen benign. We'll sign  out at the end of my shift to dr. York Cerise. Of note, when I went to check on the patient he was drinking water and his significant other was lying in the bed and he was propped up upon her,  neither of them seem to be in any significant distress and he states he feels much better.    Clinical Course    ____________________________________________   FINAL CLINICAL IMPRESSION(S) / ED DIAGNOSES  Final diagnoses:  None      This chart was dictated using voice recognition software.  Despite best efforts to proofread,  errors can occur which can change meaning.      Jeanmarie Plant, MD 01/30/16 2229    Jeanmarie Plant, MD 01/30/16 2257    Jeanmarie Plant, MD 01/30/16 1610    Jeanmarie Plant, MD 01/30/16 9604    Jeanmarie Plant, MD 01/30/16 [phone]

## 2016-01-30 NOTE — ED Notes (Addendum)
Pt states he has had n/v with no diarrhea for the last several days.  At this point it is dry heaves because he is unable to keep anything down he eats.  Patient states he has self treated his pain with BC powder at home but has not taken anything specifically for nausea.

## 2016-01-30 NOTE — Discharge Instructions (Addendum)
We believe your symptoms are caused by either a viral infection.  Your symptoms have improved, and we feel it is safe for you to go home and follow up with your regular doctor.  Please read the included information and stick to a bland diet for the next two days.  Drink plenty of clear fluids, and if you were provided with a prescription, please take it according to the label instructions.    If you develop any new or worsening symptoms, including persistent vomiting not controlled with medication, fever greater than 101, severe or worsening abdominal pain, or other symptoms that concern you, please return immediately to the Emergency Department.
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
## 2016-01-31 MED ORDER — IBUPROFEN 600 MG PO TABS
600.0000 mg | ORAL_TABLET | Freq: Once | ORAL | Status: AC
  Administered 2016-01-31: 600 mg via ORAL
  Filled 2016-01-31: qty 1

## 2016-01-31 NOTE — ED Notes (Signed)
Pt given ginger ale, will continue to monitor pt.

## 2017-06-23 ENCOUNTER — Emergency Department: Payer: Self-pay

## 2017-06-23 ENCOUNTER — Emergency Department
Admission: EM | Admit: 2017-06-23 | Discharge: 2017-06-23 | Disposition: A | Discharge status: Home / Self Care | Payer: Self-pay | Attending: Emergency Medicine | Admitting: Emergency Medicine

## 2017-06-23 ENCOUNTER — Other Ambulatory Visit: Payer: Self-pay

## 2017-06-23 ENCOUNTER — Encounter: Payer: Self-pay | Admitting: Emergency Medicine

## 2017-06-23 DIAGNOSIS — F17210 Nicotine dependence, cigarettes, uncomplicated: Secondary | ICD-10-CM | POA: Insufficient documentation

## 2017-06-23 DIAGNOSIS — R0789 Other chest pain: Secondary | ICD-10-CM | POA: Insufficient documentation

## 2017-06-23 LAB — CBC
HCT: 44.9 % (ref 40.0–52.0)
Hemoglobin: 15.3 g/dL (ref 13.0–18.0)
MCH: 32.7 pg (ref 26.0–34.0)
MCHC: 34 g/dL (ref 32.0–36.0)
MCV: 96.3 fL (ref 80.0–100.0)
Platelets: 276 K/uL (ref 150–440)
RBC: 4.66 MIL/uL (ref 4.40–5.90)
RDW: 14.6 % — ABNORMAL HIGH (ref 11.5–14.5)
WBC: 8.5 K/uL (ref 3.8–10.6)

## 2017-06-23 LAB — BASIC METABOLIC PANEL WITH GFR
Anion gap: 5 (ref 5–15)
BUN: 16 mg/dL (ref 6–20)
CO2: 24 mmol/L (ref 22–32)
Calcium: 9.1 mg/dL (ref 8.9–10.3)
Chloride: 108 mmol/L (ref 101–111)
Creatinine, Ser: 1.13 mg/dL (ref 0.61–1.24)
GFR calc Af Amer: >60 mL/min
GFR calc non Af Amer: >60 mL/min
Glucose, Bld: 92 mg/dL (ref 65–99)
Potassium: 4.2 mmol/L (ref 3.5–5.1)
Sodium: 137 mmol/L (ref 135–145)

## 2017-06-23 LAB — TROPONIN I: Troponin I: <0.03 ng/mL

## 2017-06-23 NOTE — Discharge Instructions (Addendum)
Your chest xray, blood tests, and EKG were normal today. Take ibuprofen as needed and avoid strenuous activity until your symptoms improve.

## 2017-06-23 NOTE — ED Notes (Signed)
Pt reports he was doing some heavy lifting and about a day later began to feel pain to the rt side of his chest. Pain with movement and palpation. Denies sob. No acute distress noted. Speaking in complete sentences without difficulty.

## 2017-06-23 NOTE — ED Triage Notes (Signed)
Pt arrived via POV with reports of right sided chest pain that started 5 days ago, pt states the pain was worse this morning while at rest.   Pt describes the pain as a sharp pain with movement and with a deep breath. Pain is 8/10 on the pain scale.  Pt also reports some back pain with the chest pain.

## 2017-06-23 NOTE — ED Provider Notes (Signed)
[facility] Emergency Department Provider Note  ____________________________________________  Time seen: Approximately 10:58 AM  I have reviewed the triage vital signs and the nursing notes.   HISTORY  Chief Complaint Chest Pain    HPI [name] is a 27 y.o. male with no significant past medical history who complains of pain in the right side of his chest that started after doing heavy lifting. Is been going on for 2 days, intermittent, worse with movement and change in position. Worse with palpating his right pectoralis muscle. No alleviating factors. Achy, moderate intensity. Nonradiating.      Past Medical History:  Diagnosis Date  . Migraine      There are no active problems to display for this patient.    History reviewed. No pertinent surgical history.   Prior to Admission medications   Medication Sig Start Date End Date Taking? Authorizing Provider  ibuprofen (ADVIL,MOTRIN) 800 MG tablet Take 1 tablet (800 mg total) by mouth every 8 (eight) hours as needed. 09/13/14   Beers, Charmayne Sheer, PA-C  isometheptene-acetaminophen-dichloralphenazone (MIDRIN) [phone] MG capsule Take 1 capsule by mouth every 4 (four) hours as needed for migraine. Maximum 5 capsules in 12 hours for migraine headaches, 8 capsules in 24 hours for tension headaches. 12/08/14 12/08/15  Tommi Rumps, PA-C  ondansetron (ZOFRAN) 4 MG tablet Take 1 tablet (4 mg total) by mouth daily as needed for nausea or vomiting. 01/30/16   Jeanmarie Plant, MD     Allergies Patient has no known allergies.   History reviewed. No pertinent family history.  Social History Social History   Tobacco Use  . Smoking status: Current Every Day Smoker    Packs/day: 0.25    Types: Cigarettes  . Smokeless tobacco: Never Used  Substance Use Topics  . Alcohol use: No    Comment: rarely  . Drug use: Yes    Types: Marijuana    Review of Systems  Constitutional:   No fever or  chills.   Cardiovascular:  positive as above chest pain without syncope. Respiratory:   No dyspnea or cough. Gastrointestinal:   Negative for abdominal pain, vomiting and diarrhea.  Musculoskeletal:   Negative for focal pain or swelling All other systems reviewed and are negative except as documented above in ROS and HPI.  ____________________________________________   PHYSICAL EXAM:  VITAL SIGNS: ED Triage Vitals  Enc Vitals Group     BP 06/23/17 0846 136/74     Pulse Rate 06/23/17 0846 74     Resp 06/23/17 0846 18     Temp 06/23/17 0846 98.4 F (36.9 C)     Temp Source 06/23/17 0846 Oral     SpO2 06/23/17 0846 100 %     Weight 06/23/17 0846 190 lb (86.2 kg)     Height 06/23/17 0846 6' (1.829 m)     Head Circumference --      Peak Flow --      Pain Score 06/23/17 0900 8     Pain Loc --      Pain Edu? --      Excl. in GC? --     Vital signs reviewed, nursing assessments reviewed.   Constitutional:   Alert and oriented. Well appearing and in no distress. Eyes:   Conjunctivae are normal. EOMI. PERRL. ENT      Head:   Normocephalic and atraumatic.      Nose:   No congestion/rhinnorhea.       Mouth/Throat:   MMM,  no pharyngeal erythema. No peritonsillar mass.       Neck:   No meningismus. Full ROM. Hematological/Lymphatic/Immunilogical:   No cervical lymphadenopathy. Cardiovascular:   RRR. Symmetric bilateral radial and DP pulses.  No murmurs.  Respiratory:   Normal respiratory effort without tachypnea/retractions. Breath sounds are clear and equal bilaterally. No wheezes/rales/rhonchi. Gastrointestinal:   Soft and nontender. Non distended. There is no CVA tenderness.  No rebound, rigidity, or guarding.  Musculoskeletal:   Normal range of motion in all extremities. No joint effusions.  No lower extremity tenderness.  No edema. chest pain reproducible with palpation of the pectoralis. No crepitus or inflammatory changes. Chest wall stable. Neurologic:   Normal speech and  language.  Motor grossly intact. No acute focal neurologic deficits are appreciated.  Skin:    Skin is warm, dry and intact. No rash noted.  No petechiae, purpura, or bullae.  ____________________________________________    LABS (pertinent positives/negatives) (all labs ordered are listed, but only abnormal results are displayed) Labs Reviewed  CBC - Abnormal; Notable for the following components:      Result Value   RDW 14.6 (*)    All other components within normal limits  BASIC METABOLIC PANEL  TROPONIN I   ____________________________________________   EKG  interpreted by me Normal sinus rhythm rate of 63, normal axis intervals QRS. Diffuse J-point elevation with otherwise normal ST segments and T waves, consistent with benign early repolarization.  ____________________________________________    RADIOLOGY  Dg Chest 2 View  Result Date: 06/23/2017 CLINICAL DATA:  Five days of mid chest pain which has gradually worsened. Current smoker. EXAM: CHEST - 2 VIEW COMPARISON:  None in PACs FINDINGS: The lungs are adequately inflated. There is mild hemidiaphragm flattening. The interstitial markings are coarse. There is no alveolar infiltrate, pleural effusion, or pneumothorax. The heart and pulmonary vascularity are normal. The mediastinum is normal in width. The trachea is midline. The bony thorax exhibits no acute abnormality. IMPRESSION: Bronchitic-related interstitial changes. No pneumonia or other acute cardiopulmonary abnormality. Electronically Signed   By: David  [place] M.D.   On: 06/23/2017 10:13    ____________________________________________   PROCEDURES Procedures  ____________________________________________    CLINICAL IMPRESSION / ASSESSMENT AND PLAN / ED COURSE  Pertinent labs & imaging results that were available during my care of the patient were reviewed by me and considered in my medical decision making (see chart for details).    patient well  appearing, no acute distress, presents with chest wall pain that is reproducible after heavy lifting.Considering the patient's symptoms, medical history, and physical examination today, I have low suspicion for ACS, PE, TAD, pneumothorax, carditis, mediastinitis, pneumonia, CHF, or sepsis.        ____________________________________________   FINAL CLINICAL IMPRESSION(S) / ED DIAGNOSES    Final diagnoses:  Chest wall pain     ED Discharge Orders    None      Portions of this note were generated with dragon dictation software. Dictation errors may occur despite best attempts at proofreading.    Sharman Cheek, MD 06/23/17 1102

## 2018-02-02 ENCOUNTER — Other Ambulatory Visit: Payer: Self-pay

## 2018-02-02 ENCOUNTER — Emergency Department
Admission: EM | Admit: 2018-02-02 | Discharge: 2018-02-02 | Disposition: A | Discharge status: Home / Self Care | Payer: Self-pay | Attending: Emergency Medicine | Admitting: Emergency Medicine

## 2018-02-02 ENCOUNTER — Encounter: Payer: Self-pay | Admitting: Emergency Medicine

## 2018-02-02 DIAGNOSIS — M545 Low back pain, unspecified: Secondary | ICD-10-CM

## 2018-02-02 DIAGNOSIS — F17210 Nicotine dependence, cigarettes, uncomplicated: Secondary | ICD-10-CM | POA: Insufficient documentation

## 2018-02-02 DIAGNOSIS — B081 Molluscum contagiosum: Secondary | ICD-10-CM | POA: Insufficient documentation

## 2018-02-02 MED ORDER — HYDROCORTISONE 1 % EX OINT: 1.0000 "application " | TOPICAL_OINTMENT | Freq: Two times a day (BID) | CUTANEOUS | 0 refills | Status: AC

## 2018-02-02 MED ORDER — NAPROXEN 500 MG PO TABS
500.0000 mg | ORAL_TABLET | Freq: Once | ORAL | Status: AC
  Administered 2018-02-02: 500 mg via ORAL
  Filled 2018-02-02: qty 1

## 2018-02-02 MED ORDER — CYCLOBENZAPRINE HCL 10 MG PO TABS: 10.0000 mg | ORAL_TABLET | Freq: Three times a day (TID) | ORAL | 0 refills | Status: DC | PRN

## 2018-02-02 MED ORDER — CYCLOBENZAPRINE HCL 10 MG PO TABS
10.0000 mg | ORAL_TABLET | Freq: Once | ORAL | Status: AC
  Administered 2018-02-02: 10 mg via ORAL
  Filled 2018-02-02: qty 1

## 2018-02-02 MED ORDER — NAPROXEN 500 MG PO TABS: 500.0000 mg | ORAL_TABLET | Freq: Two times a day (BID) | ORAL | 0 refills | Status: DC

## 2018-02-02 NOTE — Discharge Instructions (Signed)
Follow up with primary care or return to the ER for symptoms that change or worsen. 

## 2018-02-02 NOTE — ED Notes (Signed)
Medications given to patient and patient educated on how to take medication and what it is used for. Discharge paperwork reviewed and patient stated understanding and signed discharge.

## 2018-02-02 NOTE — ED Triage Notes (Signed)
Patient presents to the ED with mid-right sided back pain.  Patient states pain is dull and he had had pain for 2 weeks.  Patient states pain is worse when he sits still, but when he stands or lies down he says, "it really doesn't bother me."  Patient denies nausea and vomiting.  Patient also has a rash to his left arm x 2 weeks as well.

## 2018-02-02 NOTE — ED Notes (Signed)
Pt to the er for left side back pain. Pt has mild swelling to the right side of his back at the kidney. Muscles feel tighter in that area on the right than the left. Pt denies injury or accident. Pt denies pain anywhere els, burning with urination or hematuria. Pain is increased when sitting and pain free with standing and laying down.

## 2018-02-02 NOTE — ED Provider Notes (Signed)
[facility] Emergency Department Provider Note ____________________________________________  Time seen: Approximately 8:12 PM  I have reviewed the triage vital signs and the nursing notes.  HISTORY  Chief Complaint Back Pain   HPI SENCERE [name] is a 28 y.o. male who presents to the emergency department for treatment and evaluation of pain to the left side of his back. Symptoms started 2 weeks ago. No specific injury. No other related symptoms. He is also concerned about a rash to his forearms that has been been present for the past 2 weeks as well.   Past Medical History:  Diagnosis Date  . Migraine     There are no active problems to display for this patient.   History reviewed. No pertinent surgical history.  Prior to Admission medications   Medication Sig Start Date End Date Taking? Authorizing Provider  ibuprofen (ADVIL,MOTRIN) 800 MG tablet Take 1 tablet (800 mg total) by mouth every 8 (eight) hours as needed. 09/13/14   Beers, Charmayne Sheer, PA-C  isometheptene-acetaminophen-dichloralphenazone (MIDRIN) [phone] MG capsule Take 1 capsule by mouth every 4 (four) hours as needed for migraine. Maximum 5 capsules in 12 hours for migraine headaches, 8 capsules in 24 hours for tension headaches. 12/08/14 12/08/15  Tommi Rumps, PA-C  ondansetron (ZOFRAN) 4 MG tablet Take 1 tablet (4 mg total) by mouth daily as needed for nausea or vomiting. 01/30/16   Jeanmarie Plant, MD    Allergies Patient has no known allergies.  No family history on file.  Social History Social History   Tobacco Use  . Smoking status: Current Every Day Smoker    Packs/day: 0.25    Types: Cigarettes  . Smokeless tobacco: Never Used  Substance Use Topics  . Alcohol use: No    Comment: rarely  . Drug use: Yes    Types: Marijuana    Review of Systems Constitutional: Well appearing. Respiratory: Negative for dyspnea. Cardiovascular: Negative for change in skin  temperature or color. Musculoskeletal:   Negative for chronic steroid use   Negative for trauma in the presence of osteoporosis  Negative for age over 75 and trauma.  Negative for constitutional symptoms, or history of cancer   Negative for pain worse at night. Skin:  Positive for skin colored rash on forearms and elbows. Papular rash. Genitourinary: Negative for urinary retention. Rectal: Negative for fecal incontinence or new onset constipation/bowel habit changes. Hematological/Immunilogical: Negative for immunosuppression, IV drug use, or fever Neurological: Negative for burning, tingling, numb, electric, radiating pain in the extremities.                        Negative for saddle anesthesia.                        Negative for focal neurologic deficit, progressive or disabling symptoms             Negative for saddle anesthesia. ____________________________________________   PHYSICAL EXAM:  VITAL SIGNS: ED Triage Vitals  Enc Vitals Group     BP 02/02/18 1855 139/88     Pulse Rate 02/02/18 1855 90     Resp 02/02/18 1855 18     Temp 02/02/18 1855 98.4 F (36.9 C)     Temp Source 02/02/18 1855 Oral     SpO2 02/02/18 1855 99 %     Weight 02/02/18 1855 192 lb (87.1 kg)     Height 02/02/18 1855 6' (1.829 m)  Head Circumference --      Peak Flow --      Pain Score 02/02/18 1900 8     Pain Loc --      Pain Edu? --      Excl. in GC? --     Constitutional: Alert and oriented. Well appearing and in no acute distress. Eyes: Conjunctivae are clear without discharge or drainage.  Head: Atraumatic. Neck: Full, active range of motion. Respiratory: Respirations even and unlabored. Musculoskeletal: Full ROM of the back and extremities, Strength 5/5 of the lower extremities as tested. Tenderness with twisting movement on the right side. No focal bony tenderness of the lumbar spine Neurologic: Reflexes of the lower extremities are 2+. Negative straight leg raise on the right  side. Skin: Atraumatic.  Psychiatric: Behavior and affect are normal.  ____________________________________________   LABS (all labs ordered are listed, but only abnormal results are displayed)  Labs Reviewed - No data to display ____________________________________________  RADIOLOGY  Not indicated. ____________________________________________   PROCEDURES  Procedure(s) performed:  Procedures ____________________________________________   INITIAL IMPRESSION / ASSESSMENT AND PLAN / ED COURSE  Ronald BERNINGER is a 28 y.o. male who presents to the emergency department for treatment and evaluation of right-sided back pain started approximately 2 weeks ago.  He also reports that he had a rash started on the left upper arm which is now on the leg left forearm as well as the right elbow.  Initially the rash was pruritic, however today it is not.  No one else in the family has similar rash.  Symptoms and exam are most consistent with musculoskeletal source for the back pain.  Rash appears to be molluscum contagiosum.  Patient requests some type of cream to apply to the area and will be given a prescription for hydrocortisone.  He was advised that this would not prevent it from spreading and would not decrease the chances of someone else having a similar rash.  He will be treated with Naprosyn and Flexeril for back pain.  He is to follow-up with the primary care provider for choice for symptoms that are not improving in the next few days.  He will return to the emergency department for symptoms change or worsen if unable to schedule appointment.  Medications - No data to display  ED Discharge Orders    None       Pertinent labs & imaging results that were available during my care of the patient were reviewed by me and considered in my medical decision making (see chart for details).  _________________________________________   FINAL CLINICAL IMPRESSION(S) / ED  DIAGNOSES  Final diagnoses:  None     If controlled substance prescribed during this visit, 12 month history viewed on the [facility] prior to issuing an initial prescription for Schedule II or III opiod.    Chinita Pester, FNP 02/02/18 2149    Jeanmarie Plant, MD 02/02/18 2149

## 2018-02-07 ENCOUNTER — Emergency Department
Admission: EM | Admit: 2018-02-07 | Discharge: 2018-02-07 | Disposition: A | Discharge status: Home / Self Care | Payer: Self-pay | Attending: Student in an Organized Health Care Education/Training Program | Admitting: Student in an Organized Health Care Education/Training Program

## 2018-02-07 ENCOUNTER — Other Ambulatory Visit: Payer: Self-pay

## 2018-02-07 ENCOUNTER — Encounter: Payer: Self-pay | Admitting: Emergency Medicine

## 2018-02-07 DIAGNOSIS — F17210 Nicotine dependence, cigarettes, uncomplicated: Secondary | ICD-10-CM | POA: Insufficient documentation

## 2018-02-07 DIAGNOSIS — L03811 Cellulitis of head [any part, except face]: Secondary | ICD-10-CM | POA: Insufficient documentation

## 2018-02-07 MED ORDER — SILVER SULFADIAZINE 1 % EX CREA: TOPICAL_CREAM | CUTANEOUS | 0 refills | Status: AC

## 2018-02-07 MED ORDER — SULFAMETHOXAZOLE-TRIMETHOPRIM 800-160 MG PO TABS: 1.0000 | ORAL_TABLET | Freq: Two times a day (BID) | ORAL | 0 refills | Status: AC

## 2018-02-07 MED ORDER — SULFAMETHOXAZOLE-TRIMETHOPRIM 800-160 MG PO TABS
1.0000 | ORAL_TABLET | Freq: Once | ORAL | Status: AC
  Administered 2018-02-07: 1 via ORAL
  Filled 2018-02-07: qty 1

## 2018-02-07 MED ORDER — SILVER SULFADIAZINE 1 % EX CREA
TOPICAL_CREAM | Freq: Once | CUTANEOUS | Status: AC
  Administered 2018-02-07: 16:00:00 via TOPICAL

## 2018-02-07 NOTE — ED Triage Notes (Signed)
Notes small area draining pus since yesterday back of neck.

## 2018-02-07 NOTE — ED Provider Notes (Signed)
[facility] Emergency Department Provider Note ____________________________________________  Time seen: 1515  I have reviewed the triage vital signs and the nursing notes.  HISTORY  Chief Complaint  Abscess  HPI [name] is a 28 y.o. male presents himself to the ED for evaluation of spontaneously draining abscess to to nape. He reports the area has been draining for the last 2 days. He admits to using hydrogen peroxide to cleanse the area. He denies fevers, chills, or sweats. He has no medical history and take no daily meds.   Past Medical History:  Diagnosis Date  . Migraine     There are no active problems to display for this patient.  History reviewed. No pertinent surgical history.  Prior to Admission medications   Medication Sig Start Date End Date Taking? Authorizing Provider  cyclobenzaprine (FLEXERIL) 10 MG tablet Take 1 tablet (10 mg total) by mouth 3 (three) times daily as needed for muscle spasms. 02/02/18   Triplett, Cari B, FNP  hydrocortisone 1 % ointment Apply 1 application topically 2 (two) times daily. 02/02/18   Triplett, Rulon Eisenmenger B, FNP  isometheptene-acetaminophen-dichloralphenazone (MIDRIN) [phone] MG capsule Take 1 capsule by mouth every 4 (four) hours as needed for migraine. Maximum 5 capsules in 12 hours for migraine headaches, 8 capsules in 24 hours for tension headaches. 12/08/14 12/08/15  Tommi Rumps, PA-C  naproxen (NAPROSYN) 500 MG tablet Take 1 tablet (500 mg total) by mouth 2 (two) times daily with a meal. 02/02/18   Triplett, Cari B, FNP  silver sulfADIAZINE (SILVADENE) 1 % cream Apply to affected area twice daily 02/07/18   Dedra Matsuo, Charlesetta Ivory, PA-C  sulfamethoxazole-trimethoprim (BACTRIM DS,SEPTRA DS) 800-160 MG tablet Take 1 tablet by mouth 2 (two) times daily. 02/07/18   Jemiah Cuadra, Charlesetta Ivory, PA-C    Allergies Patient has no known allergies.  No family history on file.  Social History Social History    Tobacco Use  . Smoking status: Current Every Day Smoker    Packs/day: 0.25    Types: Cigarettes  . Smokeless tobacco: Never Used  Substance Use Topics  . Alcohol use: No    Comment: rarely  . Drug use: Yes    Types: Marijuana    Review of Systems  Constitutional: Negative for fever. Eyes: Negative for visual changes. ENT: Negative for sore throat. Cardiovascular: Negative for chest pain. Respiratory: Negative for shortness of breath. Gastrointestinal: Negative for abdominal pain, vomiting and diarrhea. Genitourinary: Negative for dysuria. Musculoskeletal: Negative for back pain. Skin: Negative for rash. Draining abscess to the nape Neurological: Negative for headaches, focal weakness or numbness. ____________________________________________  PHYSICAL EXAM:  VITAL SIGNS: ED Triage Vitals  Enc Vitals Group     BP 02/07/18 1401 (!) 160/85     Pulse Rate 02/07/18 1401 100     Resp 02/07/18 1401 18     Temp 02/07/18 1401 97.8 F (36.6 C)     Temp Source 02/07/18 1401 Oral     SpO2 02/07/18 1401 98 %     Weight 02/07/18 1402 196 lb (88.9 kg)     Height 02/07/18 1402 6' (1.829 m)     Head Circumference --      Peak Flow --      Pain Score 02/07/18 1402 7     Pain Loc --      Pain Edu? --      Excl. in GC? --     Constitutional: Alert and oriented. Well appearing and in no  distress. Head: Normocephalic and atraumatic. Eyes: Conjunctivae are normal. Normal extraocular movements Neck: Supple. No thyromegaly. Normal ROM. Hematological/Lymphatic/Immunological: No cervical lymphadenopathy. Cardiovascular: Normal rate, regular rhythm. Respiratory: Normal respiratory effort.  Musculoskeletal: Nontender with normal range of motion in all extremities.  Neurologic:  Normal gait without ataxia. Normal speech and language. No gross focal neurologic deficits are appreciated. Skin:  Skin is warm, dry and intact. No rash noted. Patient with a 1 cm diameter ulcerated area with  underlying induration. There is no purulence noted.  ____________________________________________  PROCEDURES  Procedures Bactrim DS 1 PO Silvadene cream  ____________________________________________  INITIAL IMPRESSION / ASSESSMENT AND PLAN / ED COURSE  Patient with ED evaluation of a draining abscess to the nape. He continues to note firmness and tenderness. He will be treated with Bactrim and silvadene. Wound care instructions have been provided. He is referred to one of the local community clinics for further/routine care.  ____________________________________________  FINAL CLINICAL IMPRESSION(S) / ED DIAGNOSES  Final diagnoses:  Cellulitis of head except face     Lissa Hoard, PA-C 02/07/18 1600    Willy Eddy, MD 02/08/18 1931

## 2018-02-07 NOTE — Discharge Instructions (Signed)
You are being treated for a cellulitis to the back of the neck. Keep the area clean and covered as needed. Apply the ointment twice daily. Take the antibiotic as directed. Apply warm compresses to promote healing.

## 2018-02-07 NOTE — ED Notes (Signed)
See triage note  Presents with possible small abscess area to back of neck  Area is drain some

## 2018-02-09 ENCOUNTER — Emergency Department
Admission: EM | Admit: 2018-02-09 | Discharge: 2018-02-09 | Disposition: A | Discharge status: Home / Self Care | Payer: Self-pay | Attending: Emergency Medicine | Admitting: Emergency Medicine

## 2018-02-09 ENCOUNTER — Other Ambulatory Visit: Payer: Self-pay

## 2018-02-09 DIAGNOSIS — L0291 Cutaneous abscess, unspecified: Secondary | ICD-10-CM

## 2018-02-09 DIAGNOSIS — L0211 Cutaneous abscess of neck: Secondary | ICD-10-CM | POA: Insufficient documentation

## 2018-02-09 DIAGNOSIS — F17210 Nicotine dependence, cigarettes, uncomplicated: Secondary | ICD-10-CM | POA: Insufficient documentation

## 2018-02-09 DIAGNOSIS — Z79899 Other long term (current) drug therapy: Secondary | ICD-10-CM | POA: Insufficient documentation

## 2018-02-09 MED ORDER — CEPHALEXIN 500 MG PO CAPS
500.0000 mg | ORAL_CAPSULE | Freq: Once | ORAL | Status: AC
Start: 2018-02-09 — End: 2018-02-09
  Administered 2018-02-09: 500 mg via ORAL
  Filled 2018-02-09: qty 1

## 2018-02-09 MED ORDER — CEPHALEXIN 500 MG PO CAPS: 500.0000 mg | ORAL_CAPSULE | Freq: Two times a day (BID) | ORAL | 0 refills | Status: DC

## 2018-02-09 MED ORDER — LIDOCAINE VISCOUS HCL 2 % MT SOLN
15.0000 mL | Freq: Once | OROMUCOSAL | Status: AC
  Administered 2018-02-09: 15 mL via OROMUCOSAL
  Filled 2018-02-09: qty 15

## 2018-02-09 MED ORDER — LIDOCAINE HCL (PF) 1 % IJ SOLN
INTRAMUSCULAR | Status: AC
  Administered 2018-02-09: 5 mL
  Filled 2018-02-09: qty 5

## 2018-02-09 MED ORDER — LIDOCAINE HCL (PF) 1 % IJ SOLN
30.0000 mL | Freq: Once | INTRAMUSCULAR | Status: AC
  Administered 2018-02-09: 5 mL

## 2018-02-09 MED ORDER — OXYCODONE-ACETAMINOPHEN 5-325 MG PO TABS
1.0000 | ORAL_TABLET | Freq: Once | ORAL | Status: AC
  Administered 2018-02-09: 1 via ORAL
  Filled 2018-02-09: qty 1

## 2018-02-09 NOTE — ED Triage Notes (Signed)
Pt here for wound check states wound to posterior neck x 4 days and was already seen here for the same and given silvadene. Pt states here because wound is not improving.

## 2018-02-09 NOTE — ED Provider Notes (Signed)
[facility] Emergency Department Provider Note   First MD Initiated Contact with Patient 02/09/18 [id number]     (approximate)  I have reviewed the triage vital signs and the nursing notes.   HISTORY  Chief Complaint Wound Check    HPI [name] is a 28 y.o. male returns to the emergency department after being seen in 02/07/2018 secondary to cellulitis on the nape of the neck.  Patient presents stating that area is increasing swelling discomfort and tenderness.  Patient denies any fever afebrile on presentation.  Patient was seen in the emergency department and prescribed Bactrim and Silvadene.  Patient that symptoms initially began when he noticed a bump on the nape of his neck which he "burst".  Following doing so the patient states that the area has persistently increased in size and discomfort with previous purulent drainage.   Past Medical History:  Diagnosis Date  . Migraine     There are no active problems to display for this patient.   No past surgical history on file.  Prior to Admission medications   Medication Sig Start Date End Date Taking? Authorizing Provider  cephALEXin (KEFLEX) 500 MG capsule Take 1 capsule (500 mg total) by mouth 2 (two) times daily for 10 days. 02/09/18 02/19/18  Darci CurrentBrown, Joyce N, MD  cyclobenzaprine (FLEXERIL) 10 MG tablet Take 1 tablet (10 mg total) by mouth 3 (three) times daily as needed for muscle spasms. 02/02/18   Triplett, Cari B, FNP  hydrocortisone 1 % ointment Apply 1 application topically 2 (two) times daily. 02/02/18   Triplett, Rulon Eisenmengerari B, FNP  isometheptene-acetaminophen-dichloralphenazone (MIDRIN) 203848739365-100-325 MG capsule Take 1 capsule by mouth every 4 (four) hours as needed for migraine. Maximum 5 capsules in 12 hours for migraine headaches, 8 capsules in 24 hours for tension headaches. 12/08/14 12/08/15  Tommi RumpsSummers, Rhonda L, PA-C  naproxen (NAPROSYN) 500 MG tablet Take 1 tablet (500 mg total) by mouth 2 (two) times  daily with a meal. 02/02/18   Triplett, Cari B, FNP  silver sulfADIAZINE (SILVADENE) 1 % cream Apply to affected area twice daily 02/07/18   Menshew, Charlesetta IvoryJenise V Bacon, PA-C  sulfamethoxazole-trimethoprim (BACTRIM DS,SEPTRA DS) 800-160 MG tablet Take 1 tablet by mouth 2 (two) times daily. 02/07/18   Menshew, Charlesetta IvoryJenise V Bacon, PA-C    Allergies Patient has no known allergies.  No family history on file.  Social History Social History   Tobacco Use  . Smoking status: Current Every Day Smoker    Packs/day: 0.25    Types: Cigarettes  . Smokeless tobacco: Never Used  Substance Use Topics  . Alcohol use: No    Comment: rarely  . Drug use: Yes    Types: Marijuana    Review of Systems Constitutional: No fever/chills Eyes: No visual changes. ENT: No sore throat. Cardiovascular: Denies chest pain. Respiratory: Denies shortness of breath. Gastrointestinal: No abdominal pain.  No nausea, no vomiting.  No diarrhea.  No constipation. Genitourinary: Negative for dysuria. Musculoskeletal: Negative for neck pain.  Negative for back pain. Integumentary: Positive for possible neck abscess Neurological: Negative for headaches, focal weakness or numbness.   ____________________________________________   PHYSICAL EXAM:  VITAL SIGNS: ED Triage Vitals  Enc Vitals Group     BP 02/09/18 0133 140/84     Pulse Rate 02/09/18 0133 (!) 120     Resp 02/09/18 0133 17     Temp 02/09/18 0133 97.9 F (36.6 C)     Temp Source 02/09/18 0133 Oral  SpO2 02/09/18 0133 100 %     Weight 02/09/18 0134 87.5 kg (193 lb)     Height 02/09/18 0134 1.829 m (6')     Head Circumference --      Peak Flow --      Pain Score 02/09/18 0138 8     Pain Loc --      Pain Edu? --      Excl. in GC? --     Constitutional: Alert and oriented. Well appearing and in no acute distress. Eyes: Conjunctivae are normal.  Mouth/Throat: Mucous membranes are moist.  Oropharynx non-erythematous. Neck: No stridor.     Cardiovascular: Normal rate, regular rhythm. Good peripheral circulation. Grossly normal heart sounds. Respiratory: Normal respiratory effort.  No retractions. Lungs CTAB. Gastrointestinal: Soft and nontender. No distention.  Musculoskeletal: No lower extremity tenderness nor edema. No gross deformities of extremities. Neurologic:  Normal speech and language. No gross focal neurologic deficits are appreciated.  Skin: 5 x 6 cm area of flocculence and induration noted nape of the neck with associated overlying erythema. Psychiatric: Mood and affect are normal. Speech and behavior are normal.  ____________________________________________    .Marland Kitchen.Incision and Drainage Date/Time: 02/09/2018 6:11 AM Performed by: Darci CurrentBrown, McLean N, MD Authorized by: Darci CurrentBrown, Demorest N, MD   Consent:    Consent obtained:  Verbal   Consent given by:  Patient   Risks discussed:  Bleeding, infection, incomplete drainage and pain   Alternatives discussed:  Alternative treatment, delayed treatment and observation Location:    Type:  Abscess   Location:  Neck (Midline posterior) Pre-procedure details:    Skin preparation:  Betadine Anesthesia (see MAR for exact dosages):    Anesthesia method:  Local infiltration   Local anesthetic:  Lidocaine 1% w/o epi Procedure type:    Complexity:  Complex Procedure details:    Incision types:  Cruciate   Scalpel blade:  11   Drainage:  Purulent and serosanguinous   Packing materials:  None Post-procedure details:    Patient tolerance of procedure:  Tolerated well, no immediate complications     ____________________________________________   INITIAL IMPRESSION / ASSESSMENT AND PLAN / ED COURSE  As part of my medical decision making, I reviewed the following data within the electronic MEDICAL RECORD NUMBER   28 year old male presenting with above-stated history and physical exam consistent with abscess to the nape of the neck.  I&D was performed with purulent drainage  expressed followed by serosanguineous drainage.  Patient given Percocet 5 325 mg tablet in the emergency department.  Patient also given Keflex.  Patient advised to follow-up within 48 hours for wound recheck. ____________________________________________  FINAL CLINICAL IMPRESSION(S) / ED DIAGNOSES  Final diagnoses:  Abscess     MEDICATIONS GIVEN DURING THIS VISIT:  Medications  lidocaine (XYLOCAINE) 2 % viscous mouth solution 15 mL (15 mLs Mouth/Throat Given 02/09/18 0223)  lidocaine (PF) (XYLOCAINE) 1 % injection 30 mL (5 mLs Other Given by Other 02/09/18 0223)  cephALEXin (KEFLEX) capsule 500 mg (500 mg Oral Given 02/09/18 0324)  oxyCODONE-acetaminophen (PERCOCET/ROXICET) 5-325 MG per tablet 1 tablet (1 tablet Oral Given 02/09/18 0324)     ED Discharge Orders         Ordered    cephALEXin (KEFLEX) 500 MG capsule  2 times daily     02/09/18 0319           Note:  This document was prepared using Dragon voice recognition software and may include unintentional dictation errors.    Bayard MalesBrown, [place]  Dorris Carnes, MD 02/09/18 [phone]

## 2018-02-10 ENCOUNTER — Other Ambulatory Visit: Payer: Self-pay

## 2018-02-10 ENCOUNTER — Encounter: Payer: Self-pay | Admitting: *Deleted

## 2018-02-10 DIAGNOSIS — R109 Unspecified abdominal pain: Secondary | ICD-10-CM | POA: Insufficient documentation

## 2018-02-10 DIAGNOSIS — L0211 Cutaneous abscess of neck: Secondary | ICD-10-CM | POA: Insufficient documentation

## 2018-02-10 DIAGNOSIS — F17210 Nicotine dependence, cigarettes, uncomplicated: Secondary | ICD-10-CM | POA: Insufficient documentation

## 2018-02-10 DIAGNOSIS — A4902 Methicillin resistant Staphylococcus aureus infection, unspecified site: Secondary | ICD-10-CM | POA: Insufficient documentation

## 2018-02-10 DIAGNOSIS — R112 Nausea with vomiting, unspecified: Secondary | ICD-10-CM | POA: Insufficient documentation

## 2018-02-10 DIAGNOSIS — F1210 Cannabis abuse, uncomplicated: Secondary | ICD-10-CM | POA: Insufficient documentation

## 2018-02-10 LAB — URINALYSIS, COMPLETE (UACMP) WITH MICROSCOPIC
Bacteria, UA: NONE SEEN
Bilirubin Urine: NEGATIVE
GLUCOSE, UA: NEGATIVE mg/dL
HGB URINE DIPSTICK: NEGATIVE
Ketones, ur: NEGATIVE mg/dL
Leukocytes, UA: NEGATIVE
NITRITE: NEGATIVE
PROTEIN: 30 mg/dL — AB
SPECIFIC GRAVITY, URINE: 1.026 (ref 1.005–1.030)
pH: 6 (ref 5.0–8.0)

## 2018-02-10 LAB — COMPREHENSIVE METABOLIC PANEL
ALBUMIN: 5 g/dL (ref 3.5–5.0)
ALK PHOS: 78 U/L (ref 38–126)
ALT: 41 U/L (ref 0–44)
AST: 40 U/L (ref 15–41)
Anion gap: 7 (ref 5–15)
BILIRUBIN TOTAL: 1.6 mg/dL — AB (ref 0.3–1.2)
BUN: 11 mg/dL (ref 6–20)
CALCIUM: 10.4 mg/dL — AB (ref 8.9–10.3)
CO2: 26 mmol/L (ref 22–32)
CREATININE: 1.18 mg/dL (ref 0.61–1.24)
Chloride: 103 mmol/L (ref 98–111)
GFR calc Af Amer: >60 mL/min (ref >60)
GLUCOSE: 104 mg/dL — AB (ref 70–99)
POTASSIUM: 3.8 mmol/L (ref 3.5–5.1)
Sodium: 136 mmol/L (ref 135–145)
TOTAL PROTEIN: 8.9 g/dL — AB (ref 6.5–8.1)

## 2018-02-10 LAB — CBC
HCT: 50.4 % (ref 39.0–52.0)
Hemoglobin: 17.5 g/dL — ABNORMAL HIGH (ref 13.0–17.0)
MCH: 32.2 pg (ref 26.0–34.0)
MCHC: 34.7 g/dL (ref 30.0–36.0)
MCV: 92.8 fL (ref 80.0–100.0)
Platelets: 313 10*3/uL (ref 150–400)
RBC: 5.43 MIL/uL (ref 4.22–5.81)
RDW: 13.7 % (ref 11.5–15.5)
WBC: 12.2 10*3/uL — AB (ref 4.0–10.5)
nRBC: 0 % (ref 0.0–0.2)

## 2018-02-10 LAB — LIPASE, BLOOD: Lipase: 26 U/L (ref 11–51)

## 2018-02-10 NOTE — ED Triage Notes (Signed)
Pt to ED reporting nausea and cramping in his stomach x 3 days. No PO intake per pt. PT reports having made himself throw up yesterday to see if that would help but denies improvement. PT also reporting chills by he did not check his temperature at home.   Pt is currently on two antibiotics for a cyst on his neck. No diarrhea.

## 2018-02-11 ENCOUNTER — Emergency Department
Admission: EM | Admit: 2018-02-11 | Discharge: 2018-02-11 | Disposition: A | Discharge status: Home / Self Care | Payer: Self-pay | Attending: Emergency Medicine | Admitting: Emergency Medicine

## 2018-02-11 DIAGNOSIS — R112 Nausea with vomiting, unspecified: Secondary | ICD-10-CM

## 2018-02-11 DIAGNOSIS — L0211 Cutaneous abscess of neck: Secondary | ICD-10-CM

## 2018-02-11 DIAGNOSIS — A4902 Methicillin resistant Staphylococcus aureus infection, unspecified site: Secondary | ICD-10-CM

## 2018-02-11 MED ORDER — ONDANSETRON 4 MG PO TBDP: 4.0000 mg | ORAL_TABLET | Freq: Once | ORAL | Status: DC

## 2018-02-11 MED ORDER — HYDROMORPHONE HCL 1 MG/ML IJ SOLN
2.0000 mg | Freq: Once | INTRAMUSCULAR | Status: AC
  Administered 2018-02-11: 2 mg via INTRAMUSCULAR
  Filled 2018-02-11: qty 2

## 2018-02-11 MED ORDER — LIDOCAINE-EPINEPHRINE 2 %-1:100000 IJ SOLN
20.0000 mL | Freq: Once | INTRAMUSCULAR | Status: AC
Start: 2018-02-11 — End: 2018-02-11
  Administered 2018-02-11: 20 mL via INTRADERMAL
  Filled 2018-02-11: qty 1

## 2018-02-11 MED ORDER — ONDANSETRON 4 MG PO TBDP: 4.0000 mg | ORAL_TABLET | Freq: Three times a day (TID) | ORAL | 0 refills | Status: AC | PRN

## 2018-02-11 MED ORDER — MUPIROCIN 2 % EX OINT: TOPICAL_OINTMENT | CUTANEOUS | 0 refills | Status: AC

## 2018-02-11 MED ORDER — ONDANSETRON 4 MG PO TBDP
ORAL_TABLET | ORAL | Status: AC
  Administered 2018-02-11: 4 mg
  Filled 2018-02-11: qty 1

## 2018-02-11 NOTE — ED Provider Notes (Signed)
[facility] Emergency Department Provider Note  ____________________________________________   First MD Initiated Contact with Patient 02/11/18 0140     (approximate)  I have reviewed the triage vital signs and the nursing notes.   HISTORY  Chief Complaint Abdominal Pain and Nausea   HPI [name] is a 28 y.o. male who comes to the emergency department with 3 days of nausea and abdominal cramping that began when he started taking Keflex.  He was seen recently for an abscess on his posterior neck and was placed on Bactrim.  The abscess worsened and he returned and had an incision and drainage performed and was placed on Keflex.  He feels that the pain in his neck is worsening and his nausea is worsening with Keflex.  No fevers or chills.  No vomiting.  The pain in his neck is now moderate to severe cramping aching throbbing and he does note sweating at night and increasing purulent discharge.    Past Medical History:  Diagnosis Date  . Migraine     There are no active problems to display for this patient.   History reviewed. No pertinent surgical history.  Prior to Admission medications   Medication Sig Start Date End Date Taking? Authorizing Provider  cyclobenzaprine (FLEXERIL) 10 MG tablet Take 1 tablet (10 mg total) by mouth 3 (three) times daily as needed for muscle spasms. 02/02/18   Triplett, Cari B, FNP  hydrocortisone 1 % ointment Apply 1 application topically 2 (two) times daily. 02/02/18   Triplett, Rulon Eisenmengerari B, FNP  isometheptene-acetaminophen-dichloralphenazone (MIDRIN) [id number] MG capsule Take 1 capsule by mouth every 4 (four) hours as needed for migraine. Maximum 5 capsules in 12 hours for migraine headaches, 8 capsules in 24 hours for tension headaches. 12/08/14 12/08/15  Tommi RumpsSummers, Rhonda L, PA-C  mupirocin ointment (BACTROBAN) 2 % Apply a small amount to each nostril twice a day for 5 days to rid yourself of MRSA 02/11/18   Merrily Brittleifenbark, Donyae Kilner,  MD  naproxen (NAPROSYN) 500 MG tablet Take 1 tablet (500 mg total) by mouth 2 (two) times daily with a meal. 02/02/18   Triplett, Cari B, FNP  ondansetron (ZOFRAN ODT) 4 MG disintegrating tablet Take 1 tablet (4 mg total) by mouth every 8 (eight) hours as needed for nausea or vomiting. 02/11/18   Merrily Brittleifenbark, Lachlan Pelto, MD  silver sulfADIAZINE (SILVADENE) 1 % cream Apply to affected area twice daily 02/07/18   Menshew, Charlesetta IvoryJenise V Bacon, PA-C  sulfamethoxazole-trimethoprim (BACTRIM DS,SEPTRA DS) 800-160 MG tablet Take 1 tablet by mouth 2 (two) times daily. 02/07/18   Menshew, Charlesetta IvoryJenise V Bacon, PA-C    Allergies Patient has no known allergies.  History reviewed. No pertinent family history.  Social History Social History   Tobacco Use  . Smoking status: Current Every Day Smoker    Packs/day: 0.25    Types: Cigarettes  . Smokeless tobacco: Never Used  Substance Use Topics  . Alcohol use: No    Comment: rarely  . Drug use: Yes    Types: Marijuana    Review of Systems Constitutional: No fever/chills Eyes: No visual changes. ENT: No sore throat. Cardiovascular: Denies chest pain. Respiratory: Denies shortness of breath. Gastrointestinal: Positive for abdominal pain.  Positive for nausea, no vomiting.  No diarrhea.  No constipation. Genitourinary: Negative for dysuria. Musculoskeletal: Negative for back pain. Skin: Positive for wound Neurological: Negative for headaches, focal weakness or numbness.   ____________________________________________   PHYSICAL EXAM:  VITAL SIGNS: ED Triage Vitals [02/10/18 2241]  Enc Vitals Group     BP (!) 141/94     Pulse Rate (!) 113     Resp 16     Temp 97.9 F (36.6 C)     Temp src      SpO2 100 %     Weight 190 lb (86.2 kg)     Height 6' (1.829 m)     Head Circumference      Peak Flow      Pain Score 10     Pain Loc      Pain Edu?      Excl. in GC?     Constitutional: Alert and oriented x4 appears obviously uncomfortable nontoxic no  diaphoresis speaks in full clear sentences Eyes: PERRL EOMI. Head: Atraumatic. Nose: No congestion/rhinnorhea. Mouth/Throat: No trismus Neck: Abscess as below Cardiovascular: Normal rate, regular rhythm. Grossly normal heart sounds.  Good peripheral circulation. Respiratory: Normal respiratory effort.  No retractions. Lungs CTAB and moving good air Gastrointestinal: Soft nondistended nontender no rebound or guarding no peritonitis Musculoskeletal: No lower extremity edema   Neurologic:  Normal speech and language. No gross focal neurologic deficits are appreciated. Skin: 6 cm abscess to posterior neck expressing purulent material.  Previous cruciate  Psychiatric: Mood and affect are normal. Speech and behavior are normal.    ____________________________________________   DIFFERENTIAL includes but not limited to  Abscess, cellulitis, medication reaction, C. difficile ____________________________________________   LABS (all labs ordered are listed, but only abnormal results are displayed)  Labs Reviewed  COMPREHENSIVE METABOLIC PANEL - Abnormal; Notable for the following components:      Result Value   Glucose, Bld 104 (*)    Calcium 10.4 (*)    Total Protein 8.9 (*)    Total Bilirubin 1.6 (*)    All other components within normal limits  CBC - Abnormal; Notable for the following components:   WBC 12.2 (*)    Hemoglobin 17.5 (*)    All other components within normal limits  URINALYSIS, COMPLETE (UACMP) WITH MICROSCOPIC - Abnormal; Notable for the following components:   Color, Urine AMBER (*)    APPearance CLEAR (*)    Protein, ur 30 (*)    All other components within normal limits  LIPASE, BLOOD    Lab work reviewed by me with slightly elevated white count which is consistent with pain versus  infection __________________________________________  EKG   ____________________________________________  RADIOLOGY   ____________________________________________   PROCEDURES  Procedure(s) performed: Yes  .Marland KitchenIncision and Drainage Date/Time: 02/11/2018 2:46 AM Performed by: Merrily Brittle, MD Authorized by: Merrily Brittle, MD   Consent:    Consent obtained:  Verbal   Consent given by:  Patient   Risks discussed:  Bleeding, incomplete drainage, pain and infection   Alternatives discussed:  Alternative treatment Location:    Type:  Abscess   Size:  6   Location:  Neck   Neck location: Midline posterior. Pre-procedure details:    Skin preparation:  Chloraprep Anesthesia (see MAR for exact dosages):    Anesthesia method:  Local infiltration   Local anesthetic:  Lidocaine 2% WITH epi Procedure type:    Complexity:  Complex Procedure details:    Needle aspiration: no     Incision types:  Single straight   Scalpel blade:  11   Wound management:  Probed and deloculated, irrigated with saline, extensive cleaning and debrided   Drainage:  Purulent   Drainage amount:  Copious   Wound treatment:  Wound left open  Packing materials:  None Post-procedure details:    Patient tolerance of procedure:  Tolerated well, no immediate complications    Critical Care performed: no  ____________________________________________   INITIAL IMPRESSION / ASSESSMENT AND PLAN / ED COURSE  Pertinent labs & imaging results that were available during my care of the patient were reviewed by me and considered in my medical decision making (see chart for details).   As part of my medical decision making, I reviewed the following data within the electronic MEDICAL RECORD NUMBER History obtained from family if available, nursing notes, old chart and ekg, as well as notes from prior ED visits.  The patient comes to the emergency department uncomfortable appearing with nausea after beginning  Keflex along with a worsening abscess to his posterior neck.  I reviewed Dr. Theora GianottiBrown's previous note and he performed an incision and drainage with a small cruciate incision that is now closed up and the abscesses recurred.  I gave the patient 2 mg of intramuscular Dilaudid for pain control then cleansed and anesthetized the wound and performed a large straight incision expressing copious amounts of purulent material and I debrided all necrotic tissue.  I then broke up all loculations.  His symptoms are significantly improved following management of the abscess.  I discussed that this is likely MRSA and he no longer needs to take the Keflex.  I have given him a 2-day wound check.  I will also encourage him to continue his Bactrim and will prescribe Bactroban and Hibiclens so he can decontaminate himself from MRSA.  2-day wound check given.  Strict return precautions have been given.      ____________________________________________   FINAL CLINICAL IMPRESSION(S) / ED DIAGNOSES  Final diagnoses:  Neck abscess  MRSA (methicillin resistant Staphylococcus aureus) infection  Nausea and vomiting, intractability of vomiting not specified, unspecified vomiting type      NEW MEDICATIONS STARTED DURING THIS VISIT:  Discharge Medication List as of 02/11/2018  2:48 AM    START taking these medications   Details  mupirocin ointment (BACTROBAN) 2 % Apply a small amount to each nostril twice a day for 5 days to rid yourself of MRSA, Print    ondansetron (ZOFRAN ODT) 4 MG disintegrating tablet Take 1 tablet (4 mg total) by mouth every 8 (eight) hours as needed for nausea or vomiting., Starting Fri 02/11/2018, Print         Note:  This document was prepared using Dragon voice recognition software and may include unintentional dictation errors.    Merrily Brittleifenbark, Dene Nazir, MD 02/13/18 2253

## 2018-02-11 NOTE — Discharge Instructions (Signed)
STOP TAKING YOUR KEFLEX (CEPHALEXIN) Continue your bactrim (trimethoprim-sulfamethoxazole) twice a day for a full 10 days Use your bactroban ointment in each nostril twice a day for 5 days to help rid yourself of MRSA Use over the counter HIBICLENS and wash yourself head to toe tomorrow and once again in 1 week Begin taking over the counter probiotics to help with your GI upset  Return in 2 days for a wound check.  Return sooner for any concerns.  It was a pleasure to take care of you today, and thank you for coming to our emergency department.  If you have any questions or concerns before leaving please ask the nurse to grab me and I'm more than happy to go through your aftercare instructions again.  If you were prescribed any opioid pain medication today such as Norco, Vicodin, Percocet, morphine, hydrocodone, or oxycodone please make sure you do not drive when you are taking this medication as it can alter your ability to drive safely.  If you have any concerns once you are home that you are not improving or are in fact getting worse before you can make it to your follow-up appointment, please do not hesitate to call [phone] and come back for further evaluation.  Merrily Brittle, MD  Results for orders placed or performed during the hospital encounter of 02/11/18  Lipase, blood  Result Value Ref Range   Lipase 26 11 - 51 U/L  Comprehensive metabolic panel  Result Value Ref Range   Sodium 136 135 - 145 mmol/L   Potassium 3.8 3.5 - 5.1 mmol/L   Chloride 103 98 - 111 mmol/L   CO2 26 22 - 32 mmol/L   Glucose, Bld 104 (H) 70 - 99 mg/dL   BUN 11 6 - 20 mg/dL   Creatinine, Ser 7.68 0.61 - 1.24 mg/dL   Calcium 08.8 (H) 8.9 - 10.3 mg/dL   Total Protein 8.9 (H) 6.5 - 8.1 g/dL   Albumin 5.0 3.5 - 5.0 g/dL   AST 40 15 - 41 U/L   ALT 41 0 - 44 U/L   Alkaline Phosphatase 78 38 - 126 U/L   Total Bilirubin 1.6 (H) 0.3 - 1.2 mg/dL   GFR calc non Af Amer >60 >60 mL/min   GFR calc Af Amer >60 >60  mL/min   Anion gap 7 5 - 15  CBC  Result Value Ref Range   WBC 12.2 (H) 4.0 - 10.5 K/uL   RBC 5.43 4.22 - 5.81 MIL/uL   Hemoglobin 17.5 (H) 13.0 - 17.0 g/dL   HCT 11.0 31.5 - 94.5 %   MCV 92.8 80.0 - 100.0 fL   MCH 32.2 26.0 - 34.0 pg   MCHC 34.7 30.0 - 36.0 g/dL   RDW 85.9 29.2 - 44.6 %   Platelets 313 150 - 400 K/uL   nRBC 0.0 0.0 - 0.2 %  Urinalysis, Complete w Microscopic  Result Value Ref Range   Color, Urine AMBER (A) YELLOW   APPearance CLEAR (A) CLEAR   Specific Gravity, Urine 1.026 1.005 - 1.030   pH 6.0 5.0 - 8.0   Glucose, UA NEGATIVE NEGATIVE mg/dL   Hgb urine dipstick NEGATIVE NEGATIVE   Bilirubin Urine NEGATIVE NEGATIVE   Ketones, ur NEGATIVE NEGATIVE mg/dL   Protein, ur 30 (A) NEGATIVE mg/dL   Nitrite NEGATIVE NEGATIVE   Leukocytes, UA NEGATIVE NEGATIVE   RBC / HPF 11-20 0 - 5 RBC/hpf   WBC, UA 0-5 0 - 5 WBC/hpf  Bacteria, UA NONE SEEN NONE SEEN   Squamous Epithelial / LPF 0-5 0 - 5   Mucus PRESENT

## 2018-02-12 ENCOUNTER — Other Ambulatory Visit: Payer: Self-pay

## 2018-02-12 ENCOUNTER — Encounter: Payer: Self-pay | Admitting: Emergency Medicine

## 2018-02-12 ENCOUNTER — Emergency Department
Admission: EM | Admit: 2018-02-12 | Discharge: 2018-02-12 | Disposition: A | Discharge status: Home / Self Care | Payer: Self-pay | Attending: Emergency Medicine | Admitting: Emergency Medicine

## 2018-02-12 DIAGNOSIS — Z5189 Encounter for other specified aftercare: Secondary | ICD-10-CM

## 2018-02-12 DIAGNOSIS — F17210 Nicotine dependence, cigarettes, uncomplicated: Secondary | ICD-10-CM | POA: Insufficient documentation

## 2018-02-12 DIAGNOSIS — Z4801 Encounter for change or removal of surgical wound dressing: Secondary | ICD-10-CM | POA: Insufficient documentation

## 2018-02-12 NOTE — ED Provider Notes (Signed)
[facility] Emergency Department Provider Note  ____________________________________________  Time seen: Approximately 6:24 PM  I have reviewed the triage vital signs and the nursing notes.   HISTORY  Chief Complaint Wound Check    HPI [name] is a 28 y.o. male who presents the emergency department for wound check.  Patient had an abscess to the posterior neck.  This was previously incised and drained in this department he was instructed to follow-up.  No packing has been placed.  Patient is still on his antibiotics and states that he is only halfway through antibiotic course.  Patient denies any pain in the region.  He does endorse some mild continual drainage on the dressing.  No fevers or chills, chest pain, shortness of breath abdominal pain, nausea vomiting.    Past Medical History:  Diagnosis Date  . Migraine     There are no active problems to display for this patient.   History reviewed. No pertinent surgical history.  Prior to Admission medications   Medication Sig Start Date End Date Taking? Authorizing Provider  cyclobenzaprine (FLEXERIL) 10 MG tablet Take 1 tablet (10 mg total) by mouth 3 (three) times daily as needed for muscle spasms. 02/02/18   Triplett, Cari B, FNP  hydrocortisone 1 % ointment Apply 1 application topically 2 (two) times daily. 02/02/18   Triplett, Rulon Eisenmengerari B, FNP  isometheptene-acetaminophen-dichloralphenazone (MIDRIN) [phone] [id number] MG capsule Take 1 capsule by mouth every 4 (four) hours as needed for migraine. Maximum 5 capsules in 12 hours for migraine headaches, 8 capsules in 24 hours for tension headaches. 12/08/14 12/08/15  Tommi RumpsSummers, Rhonda L, PA-C  mupirocin ointment (BACTROBAN) 2 % Apply a small amount to each nostril twice a day for 5 days to rid yourself of MRSA 02/11/18   Merrily Brittleifenbark, Neil, MD  naproxen (NAPROSYN) 500 MG tablet Take 1 tablet (500 mg total) by mouth 2 (two) times daily with a meal. 02/02/18   Triplett, Cari  B, FNP  ondansetron (ZOFRAN ODT) 4 MG disintegrating tablet Take 1 tablet (4 mg total) by mouth every 8 (eight) hours as needed for nausea or vomiting. 02/11/18   Merrily Brittleifenbark, Neil, MD  silver sulfADIAZINE (SILVADENE) 1 % cream Apply to affected area twice daily 02/07/18   Menshew, Charlesetta IvoryJenise V Bacon, PA-C  sulfamethoxazole-trimethoprim (BACTRIM DS,SEPTRA DS) 800-160 MG tablet Take 1 tablet by mouth 2 (two) times daily. 02/07/18   Menshew, Charlesetta IvoryJenise V Bacon, PA-C    Allergies Patient has no known allergies.  No family history on file.  Social History Social History   Tobacco Use  . Smoking status: Current Every Day Smoker    Packs/day: 0.25    Types: Cigarettes  . Smokeless tobacco: Never Used  Substance Use Topics  . Alcohol use: No    Comment: rarely  . Drug use: Yes    Types: Marijuana     Review of Systems  Constitutional: No fever/chills Eyes: No visual changes.  Cardiovascular: no chest pain. Respiratory: no cough. No SOB. Gastrointestinal: No abdominal pain.  No nausea, no vomiting.  No diarrhea.  No constipation. Musculoskeletal: Negative for musculoskeletal pain. Skin: Positive for wound check for previously drained abscess to the posterior neck Neurological: Negative for headaches, focal weakness or numbness. 10-point ROS otherwise negative.  ____________________________________________   PHYSICAL EXAM:  VITAL SIGNS: ED Triage Vitals  Enc Vitals Group     BP 02/12/18 1804 (!) 153/103     Pulse Rate 02/12/18 1804 100     Resp 02/12/18 1804 18  Temp 02/12/18 1804 98.1 F (36.7 C)     Temp Source 02/12/18 1804 Oral     SpO2 02/12/18 1804 98 %     Weight 02/12/18 1806 189 lb (85.7 kg)     Height 02/12/18 1806 6' (1.829 m)     Head Circumference --      Peak Flow --      Pain Score 02/12/18 1806 0     Pain Loc --      Pain Edu? --      Excl. in GC? --      Constitutional: Alert and oriented. Well appearing and in no acute distress. Eyes: Conjunctivae are  normal. PERRL. EOMI. Head: Atraumatic. ENT:      Ears:       Nose: No congestion/rhinnorhea.      Mouth/Throat: Mucous membranes are moist.  Neck: No stridor.  Visualized site of previous abscess was identified to the posterior neck.  No active drainage.  Area appears to be healing appropriately compared to previous notes.  Area is redressed after exam.  Cardiovascular: Normal rate, regular rhythm. Normal S1 and S2.  Good peripheral circulation. Respiratory: Normal respiratory effort without tachypnea or retractions. Lungs CTAB. Good air entry to the bases with no decreased or absent breath sounds. Musculoskeletal: Full range of motion to all extremities. No gross deformities appreciated. Neurologic:  Normal speech and language. No gross focal neurologic deficits are appreciated.  Skin:  Skin is warm, dry and intact. No rash noted. Psychiatric: Mood and affect are normal. Speech and behavior are normal. Patient exhibits appropriate insight and judgement.   ____________________________________________   LABS (all labs ordered are listed, but only abnormal results are displayed)  Labs Reviewed - No data to display ____________________________________________  EKG   ____________________________________________  RADIOLOGY   No results found.  ____________________________________________    PROCEDURES  Procedure(s) performed:    Procedures    Medications - No data to display   ____________________________________________   INITIAL IMPRESSION / ASSESSMENT AND PLAN / ED COURSE  Pertinent labs & imaging results that were available during my care of the patient were reviewed by me and considered in my medical decision making (see chart for details).  Review of the [place] CSRS was performed in accordance of the [facility] prior to dispensing any controlled drugs.      Patient's diagnosis is consistent with encounter for wound check.  Patient presents emergency department  complaining of abscess to the posterior neck that was previously drained.  Patient is taking his antibiotics as prescribed.  Wound appears to be healing appropriately when compared with previous notes.  Continued wound care instructions are given to patient.  Patient is to continue antibiotics previously prescribed.  Follow-up primary care as needed..  Patient is given ED precautions to return to the ED for any worsening or new symptoms.     ____________________________________________  FINAL CLINICAL IMPRESSION(S) / ED DIAGNOSES  Final diagnoses:  Visit for wound check      NEW MEDICATIONS STARTED DURING THIS VISIT:  ED Discharge Orders    None          This chart was dictated using voice recognition software/Dragon. Despite best efforts to proofread, errors can occur which can change the meaning. Any change was purely unintentional.    Racheal PatchesCuthriell, Jonathan D, PA-C 02/12/18 1850    Jeanmarie PlantMcShane, James A, MD 02/12/18 2126

## 2018-02-12 NOTE — ED Notes (Signed)
Pt has open wound to back of head. Yellow/pink 2cm in size. No d/c noted. Pt had wound covered with large Band-Aid.

## 2018-02-12 NOTE — ED Triage Notes (Signed)
Wound check for sore back of neck.

## 2018-02-12 NOTE — ED Notes (Signed)
Pt verbalized understanding of discharge instructions. NAD at this time. 

## 2018-02-15 ENCOUNTER — Other Ambulatory Visit: Payer: Self-pay

## 2018-02-15 ENCOUNTER — Encounter: Payer: Self-pay | Admitting: Emergency Medicine

## 2018-02-15 DIAGNOSIS — F17210 Nicotine dependence, cigarettes, uncomplicated: Secondary | ICD-10-CM | POA: Insufficient documentation

## 2018-02-15 DIAGNOSIS — Z79899 Other long term (current) drug therapy: Secondary | ICD-10-CM | POA: Insufficient documentation

## 2018-02-15 DIAGNOSIS — R590 Localized enlarged lymph nodes: Secondary | ICD-10-CM | POA: Insufficient documentation

## 2018-02-15 NOTE — ED Triage Notes (Addendum)
Pt presents to ED with 4 knots to right side of his neck. Pt reports he first noticed the affected area this morning. Denies pain, warmth, or drainage. Denies similar symptoms previously. States it is difficult to swallow. Pt reports he was recently taking an antibiotic for an abscess that was drained recently on his neck. Bandage in place on back of neck.

## 2018-02-16 ENCOUNTER — Emergency Department
Admission: EM | Admit: 2018-02-16 | Discharge: 2018-02-16 | Disposition: A | Discharge status: Home / Self Care | Payer: Self-pay | Attending: Emergency Medicine | Admitting: Emergency Medicine

## 2018-02-16 DIAGNOSIS — R590 Localized enlarged lymph nodes: Secondary | ICD-10-CM

## 2018-02-16 MED ORDER — DEXAMETHASONE 10 MG/ML FOR PEDIATRIC ORAL USE
10.0000 mg | Freq: Once | INTRAMUSCULAR | Status: AC
  Administered 2018-02-16: 10 mg via ORAL
  Filled 2018-02-16: qty 1

## 2018-02-16 NOTE — ED Provider Notes (Signed)
[facility] Emergency Department Provider Note  ____________________________________________   First MD Initiated Contact with Patient 02/16/18 0118     (approximate)  I have reviewed the triage vital signs and the nursing notes.   HISTORY  Chief Complaint Cyst    HPI [name] is a 28 y.o. male with a difficult recent history of an abscess on the back of his head and neck requiring 2 different attempts at incision and drainage.  This is his sixth ED visit over the last couple of weeks.  He presents tonight for evaluation of some bumps that he has on the right front side of his neck that he has not noticed before.  They are little bit painful and he states that there are 4 of them.  He just noticed them this morning when he woke up.  He says that the wound on the back of his head is healing well and he is about halfway through his course of Bactrim.  He feels a little bit of pain in his throat when he swallows but is having no trouble speaking, swallowing, eating, drinking, etc.  He denies fever/chills, chest pain or shortness of breath, nausea, vomiting, abdominal pain.  The bumps on the side of his neck are small and there is no swelling in or around the wound on the back of his head and there is no connection between the 2 areas.  Past Medical History:  Diagnosis Date  . Migraine     There are no active problems to display for this patient.   No past surgical history on file.  Prior to Admission medications   Medication Sig Start Date End Date Taking? Authorizing Provider  cyclobenzaprine (FLEXERIL) 10 MG tablet Take 1 tablet (10 mg total) by mouth 3 (three) times daily as needed for muscle spasms. 02/02/18   Triplett, Cari B, FNP  hydrocortisone 1 % ointment Apply 1 application topically 2 (two) times daily. 02/02/18   Triplett, Rulon Eisenmenger B, FNP  isometheptene-acetaminophen-dichloralphenazone (MIDRIN) [id number] MG capsule Take 1 capsule by mouth every 4  (four) hours as needed for migraine. Maximum 5 capsules in 12 hours for migraine headaches, 8 capsules in 24 hours for tension headaches. 12/08/14 12/08/15  Tommi Rumps, PA-C  mupirocin ointment (BACTROBAN) 2 % Apply a small amount to each nostril twice a day for 5 days to rid yourself of MRSA 02/11/18   Merrily Brittle, MD  naproxen (NAPROSYN) 500 MG tablet Take 1 tablet (500 mg total) by mouth 2 (two) times daily with a meal. 02/02/18   Triplett, Cari B, FNP  ondansetron (ZOFRAN ODT) 4 MG disintegrating tablet Take 1 tablet (4 mg total) by mouth every 8 (eight) hours as needed for nausea or vomiting. 02/11/18   Merrily Brittle, MD  silver sulfADIAZINE (SILVADENE) 1 % cream Apply to affected area twice daily 02/07/18   Menshew, Charlesetta Ivory, PA-C  sulfamethoxazole-trimethoprim (BACTRIM DS,SEPTRA DS) 800-160 MG tablet Take 1 tablet by mouth 2 (two) times daily. 02/07/18   Menshew, Charlesetta Ivory, PA-C    Allergies Patient has no known allergies.  No family history on file.  Social History Social History   Tobacco Use  . Smoking status: Current Every Day Smoker    Packs/day: 0.25    Types: Cigarettes  . Smokeless tobacco: Never Used  Substance Use Topics  . Alcohol use: No    Comment: rarely  . Drug use: Yes    Types: Marijuana    Review of  Systems Constitutional: No fever/chills Eyes: No visual changes. ENT: Four painful bumps on the side of his neck.  Mild sore throat. Gastrointestinal: No nausea, no vomiting.   Genitourinary: Negative for dysuria. Musculoskeletal: Negative for neck pain.  Negative for back pain. Integumentary: Healing wound on the back of his head from a recent incision and drainage of abscess. Neurological: Negative for headaches, focal weakness or numbness.   ____________________________________________   PHYSICAL EXAM:  VITAL SIGNS: ED Triage Vitals  Enc Vitals Group     BP 02/15/18 2346 (!) 158/94     Pulse Rate 02/15/18 2346 81     Resp  02/15/18 2346 16     Temp 02/15/18 2346 98 F (36.7 C)     Temp Source 02/15/18 2346 Oral     SpO2 02/15/18 2346 100 %     Weight 02/15/18 2336 86.2 kg (190 lb)     Height 02/15/18 2336 1.829 m (6')     Head Circumference --      Peak Flow --      Pain Score 02/15/18 2335 5     Pain Loc --      Pain Edu? --      Excl. in GC? --     Constitutional: Alert and oriented. Well appearing and in no acute distress.  Healthy-appearing with a normal body habitus. Eyes: Conjunctivae are normal.  Head: Atraumatic. Nose: No congestion/rhinnorhea. Mouth/Throat: Mucous membranes are moist.  Oropharynx non-erythematous without exudate or petechiae.  No evidence of peritonsillar abscess. Neck: No stridor.  No meningeal signs.  Patient has right anterior cervical lymphadenopathy with 4 rubbery small (about 0.5-cm in diameter) lymph nodes in a vertical line down the side of his neck roughly in line with the sternocleidomastoid on the right side.  There is no evidence of induration, cellulitis, fluctuance, or other acute bacterial infection in their most consistent with some mild lymphadenopathy. Cardiovascular: Normal rate, regular rhythm. Good peripheral circulation. Grossly normal heart sounds. Respiratory: Normal respiratory effort.  No retractions. Lungs CTAB. Neurologic:  Normal speech and language. No gross focal neurologic deficits are appreciated.  Skin:  Skin is warm, dry and intact except for a well-healing wound on the back of his head consistent with the prior incision and drainage.  There is granulation tissue and no purulent discharge no evidence of active infection at this time. Psychiatric: Mood and affect are normal. Speech and behavior are normal.  ____________________________________________   LABS (all labs ordered are listed, but only abnormal results are displayed)  Labs Reviewed - No data to display ____________________________________________  EKG  None - EKG not ordered by  ED physician ____________________________________________  RADIOLOGY   ED MD interpretation: No indication for imaging  Official radiology report(s): No results found.  ____________________________________________   PROCEDURES  Critical Care performed: No   Procedure(s) performed:   Procedures   ____________________________________________   INITIAL IMPRESSION / ASSESSMENT AND PLAN / ED COURSE  As part of my medical decision making, I reviewed the following data within the electronic MEDICAL RECORD NUMBER Nursing notes reviewed and incorporated, Old chart reviewed and Notes from prior ED visits    The patient has some right-sided cervical lymphadenopathy in the setting of recent abscess and 2 different incision and drainages while on Bactrim and previously on Keflex.  There are no warning signs or symptoms associated with the small discrete lymph nodes that he currently is exhibiting.  It is entirely possible these were present previously and he just noticed them, but  he is understandably concerned given all the issues he has had with the wound on the back of his head and neck.  I provided reassurance and explained that I do not think that a CT scan would be beneficial for him at this time.  I will give him a dose of Decadron 10 mg p.o. to help with swelling and inflammation but explained that I believe that this is his body's reaction to the recent inflammation and infection and that it should go down with time.  I am giving him the name and number of ENT with whom he can follow-up If the nodes do not resolved and I gave return precautions should his symptoms get worse.  He understands and agrees with the plan.  I encouraged him to finish his course of Bactrim.     ____________________________________________  FINAL CLINICAL IMPRESSION(S) / ED DIAGNOSES  Final diagnoses:  Lymphadenopathy of right cervical region     MEDICATIONS GIVEN DURING THIS VISIT:  Medications    dexamethasone (DECADRON) 10 MG/ML injection for Pediatric ORAL use 10 mg (10 mg Oral Given 02/16/18 0202)     ED Discharge Orders    None       Note:  This document was prepared using Dragon voice recognition software and may include unintentional dictation errors.    Loleta RoseForbach, Janaiya Beauchesne, MD 02/16/18 [phone]

## 2018-03-10 ENCOUNTER — Other Ambulatory Visit: Payer: Self-pay

## 2018-03-10 ENCOUNTER — Emergency Department
Admission: EM | Admit: 2018-03-10 | Discharge: 2018-03-10 | Disposition: A | Discharge status: Home / Self Care | Payer: Self-pay | Attending: Emergency Medicine | Admitting: Emergency Medicine

## 2018-03-10 ENCOUNTER — Encounter: Payer: Self-pay | Admitting: Emergency Medicine

## 2018-03-10 DIAGNOSIS — Z79899 Other long term (current) drug therapy: Secondary | ICD-10-CM | POA: Insufficient documentation

## 2018-03-10 DIAGNOSIS — L02811 Cutaneous abscess of head [any part, except face]: Secondary | ICD-10-CM | POA: Insufficient documentation

## 2018-03-10 DIAGNOSIS — F17210 Nicotine dependence, cigarettes, uncomplicated: Secondary | ICD-10-CM | POA: Insufficient documentation

## 2018-03-10 DIAGNOSIS — L0291 Cutaneous abscess, unspecified: Secondary | ICD-10-CM

## 2018-03-10 MED ORDER — MUPIROCIN 2 % EX OINT: TOPICAL_OINTMENT | CUTANEOUS | 0 refills | Status: AC

## 2018-03-10 MED ORDER — SULFAMETHOXAZOLE-TRIMETHOPRIM 800-160 MG PO TABS: 1.0000 | ORAL_TABLET | Freq: Two times a day (BID) | ORAL | 0 refills | Status: AC

## 2018-03-10 MED ORDER — SULFAMETHOXAZOLE-TRIMETHOPRIM 800-160 MG PO TABS
1.0000 | ORAL_TABLET | Freq: Once | ORAL | Status: AC
  Administered 2018-03-10: 1 via ORAL
  Filled 2018-03-10: qty 1

## 2018-03-10 NOTE — ED Provider Notes (Signed)
[facility] Emergency Department Provider Note  ____________________________________________   First MD Initiated Contact with Patient 03/10/18 0410     (approximate)  I have reviewed the triage vital signs and the nursing notes.   HISTORY  Chief Complaint Abscess    HPI [name] is a 28 y.o. male with history of multiple abscesses in the past presents to the emergency department with concern for an abscess to the top of his scalp which patient stated that he noted 2 days ago.  Patient states that tonight he was able to squeeze what appeared to be purulent discharge from the area.  Patient denies any fever.   Past Medical History:  Diagnosis Date  . Migraine     There are no active problems to display for this patient.   History reviewed. No pertinent surgical history.  Prior to Admission medications   Medication Sig Start Date End Date Taking? Authorizing Provider  cyclobenzaprine (FLEXERIL) 10 MG tablet Take 1 tablet (10 mg total) by mouth 3 (three) times daily as needed for muscle spasms. 02/02/18   Triplett, Cari B, FNP  hydrocortisone 1 % ointment Apply 1 application topically 2 (two) times daily. 02/02/18   Triplett, Rulon Eisenmenger B, FNP  isometheptene-acetaminophen-dichloralphenazone (MIDRIN) [phone] MG capsule Take 1 capsule by mouth every 4 (four) hours as needed for migraine. Maximum 5 capsules in 12 hours for migraine headaches, 8 capsules in 24 hours for tension headaches. 12/08/14 12/08/15  Tommi Rumps, PA-C  mupirocin ointment (BACTROBAN) 2 % Apply a small amount to each nostril twice a day for 5 days to rid yourself of MRSA 02/11/18   Merrily Brittle, MD  mupirocin ointment Idelle Jo) 2 % Apply to affected area 3 times daily 03/10/18 03/10/19  Darci Current, MD  naproxen (NAPROSYN) 500 MG tablet Take 1 tablet (500 mg total) by mouth 2 (two) times daily with a meal. 02/02/18   Triplett, Cari B, FNP  ondansetron (ZOFRAN ODT) 4 MG  disintegrating tablet Take 1 tablet (4 mg total) by mouth every 8 (eight) hours as needed for nausea or vomiting. 02/11/18   Merrily Brittle, MD  silver sulfADIAZINE (SILVADENE) 1 % cream Apply to affected area twice daily 02/07/18   Menshew, Charlesetta Ivory, PA-C  sulfamethoxazole-trimethoprim (BACTRIM DS,SEPTRA DS) 800-160 MG tablet Take 1 tablet by mouth 2 (two) times daily. 02/07/18   Menshew, Charlesetta Ivory, PA-C  sulfamethoxazole-trimethoprim (BACTRIM DS,SEPTRA DS) 800-160 MG tablet Take 1 tablet by mouth 2 (two) times daily for 10 days. 03/10/18 03/20/18  Darci Current, MD    Allergies Patient has no known allergies.  No family history on file.  Social History Social History   Tobacco Use  . Smoking status: Current Every Day Smoker    Packs/day: 0.25    Types: Cigarettes  . Smokeless tobacco: Never Used  Substance Use Topics  . Alcohol use: No    Comment: rarely  . Drug use: Yes    Types: Marijuana    Review of Systems Constitutional: No fever/chills Eyes: No visual changes. ENT: No sore throat. Cardiovascular: Denies chest pain. Respiratory: Denies shortness of breath. Gastrointestinal: No abdominal pain.  No nausea, no vomiting.  No diarrhea.  No constipation. Genitourinary: Negative for dysuria. Musculoskeletal: Negative for neck pain.  Negative for back pain. Integumentary: Negative for rash.  Positive for possible abscess on the scalp Neurological: Negative for headaches, focal weakness or numbness.   ____________________________________________   PHYSICAL EXAM:  VITAL SIGNS: ED Triage Vitals  Enc Vitals Group     BP 03/10/18 0302 (!) 158/100     Pulse Rate 03/10/18 0302 99     Resp 03/10/18 0302 20     Temp 03/10/18 0302 97.6 F (36.4 C)     Temp Source 03/10/18 0302 Oral     SpO2 03/10/18 0302 99 %     Weight 03/10/18 0258 86.2 kg (190 lb)     Height 03/10/18 0258 1.829 m (6')     Head Circumference --      Peak Flow --      Pain Score 03/10/18  0258 8     Pain Loc --      Pain Edu? --      Excl. in GC? --     Constitutional: Alert and oriented. Well appearing and in no acute distress. Eyes: Conjunctivae are normal.  Head: Papule noted on the vertex of the scalp with evidence of recent drainage however no flocculence at this time no purulent drainage identified. Mouth/Throat: Mucous membranes are moist.  Skin: Papule noted on the vertex of the scalp with evidence of recent drainage however no flocculence     Procedures   ____________________________________________   INITIAL IMPRESSION / ASSESSMENT AND PLAN / ED COURSE  As part of my medical decision making, I reviewed the following data within the electronic MEDICAL RECORD NUMBER   28 year old male presenting with above-stated history and physical exam secondary to draining pustule on the top of the scalp.  No active drainage at this time no evidence of flocculence and as such I&D not performed.  Patient given Bactrim given concern for possible MRSA ____________________________________________  FINAL CLINICAL IMPRESSION(S) / ED DIAGNOSES  Final diagnoses:  Abscess     MEDICATIONS GIVEN DURING THIS VISIT:  Medications  sulfamethoxazole-trimethoprim (BACTRIM DS,SEPTRA DS) 800-160 MG per tablet 1 tablet (has no administration in time range)     ED Discharge Orders         Ordered    sulfamethoxazole-trimethoprim (BACTRIM DS,SEPTRA DS) 800-160 MG tablet  2 times daily     03/10/18 0430    mupirocin ointment (BACTROBAN) 2 %     03/10/18 0430           Note:  This document was prepared using Dragon voice recognition software and may include unintentional dictation errors.   Darci CurrentBrown, [place] N, MD 03/10/18 [phone]

## 2018-03-10 NOTE — ED Triage Notes (Signed)
Patient ambulatory to triage with steady gait, without difficulty or distress noted; pt reports abscess to top of scalp x 2 days with hx of same

## 2019-11-13 ENCOUNTER — Emergency Department
Admission: EM | Admit: 2019-11-13 | Discharge: 2019-11-13 | Disposition: A | Discharge status: Home / Self Care | Payer: Self-pay | Attending: Emergency Medicine | Admitting: Emergency Medicine

## 2019-11-13 ENCOUNTER — Other Ambulatory Visit: Payer: Self-pay

## 2019-11-13 ENCOUNTER — Encounter: Payer: Self-pay | Admitting: Emergency Medicine

## 2019-11-13 ENCOUNTER — Emergency Department: Payer: Self-pay

## 2019-11-13 DIAGNOSIS — R109 Unspecified abdominal pain: Secondary | ICD-10-CM

## 2019-11-13 DIAGNOSIS — K529 Noninfective gastroenteritis and colitis, unspecified: Secondary | ICD-10-CM | POA: Insufficient documentation

## 2019-11-13 DIAGNOSIS — F17210 Nicotine dependence, cigarettes, uncomplicated: Secondary | ICD-10-CM | POA: Insufficient documentation

## 2019-11-13 DIAGNOSIS — I10 Essential (primary) hypertension: Secondary | ICD-10-CM | POA: Insufficient documentation

## 2019-11-13 LAB — CBC
HCT: 45.9 % (ref 39.0–52.0)
Hemoglobin: 16.3 g/dL (ref 13.0–17.0)
MCH: 34.3 pg — ABNORMAL HIGH (ref 26.0–34.0)
MCHC: 35.5 g/dL (ref 30.0–36.0)
MCV: 96.6 fL (ref 80.0–100.0)
Platelets: 319 10*3/uL (ref 150–400)
RBC: 4.75 MIL/uL (ref 4.22–5.81)
RDW: 13.1 % (ref 11.5–15.5)
WBC: 7 10*3/uL (ref 4.0–10.5)
nRBC: 0 % (ref 0.0–0.2)

## 2019-11-13 LAB — COMPREHENSIVE METABOLIC PANEL
ALT: 31 U/L (ref 0–44)
AST: 26 U/L (ref 15–41)
Albumin: 4.4 g/dL (ref 3.5–5.0)
Alkaline Phosphatase: 62 U/L (ref 38–126)
Anion gap: 8 (ref 5–15)
BUN: 9 mg/dL (ref 6–20)
CO2: 26 mmol/L (ref 22–32)
Calcium: 9.7 mg/dL (ref 8.9–10.3)
Chloride: 104 mmol/L (ref 98–111)
Creatinine, Ser: 1.16 mg/dL (ref 0.61–1.24)
GFR, Estimated: >60 mL/min (ref >60)
Glucose, Bld: 116 mg/dL — ABNORMAL HIGH (ref 70–99)
Potassium: 4 mmol/L (ref 3.5–5.1)
Sodium: 138 mmol/L (ref 135–145)
Total Bilirubin: 1.7 mg/dL — ABNORMAL HIGH (ref 0.3–1.2)
Total Protein: 7.6 g/dL (ref 6.5–8.1)

## 2019-11-13 LAB — LIPASE, BLOOD: Lipase: 33 U/L (ref 11–51)

## 2019-11-13 LAB — TYPE AND SCREEN
ABO/RH(D): A POS
Antibody Screen: NEGATIVE

## 2019-11-13 IMAGING — CT CT ABD-PELV W/ CM
2 of 4 series · 16 of 46 positions shown, 18 images · IV contrast (APPLIED)
Comparison: None.

CLINICAL DATA: Abdominal distension, vomiting.

EXAM:
CT ABDOMEN AND PELVIS WITH CONTRAST
TECHNIQUE: Multidetector CT imaging of the abdomen and pelvis was performed
using the standard protocol following bolus administration of
intravenous contrast.
CONTRAST:  100mL OMNIPAQUE IOHEXOL 300 MG/ML  SOLN

[Series 2: routine abd/pel with · axial · 0.75mm/px · z∈[-540,-95]mm · 13 of 99 slices shown, 15 images]
[im 5/99  soft-tissue]
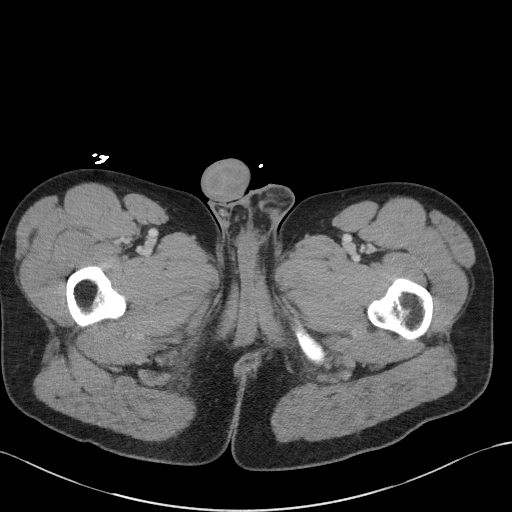
[im 5/99  bone]
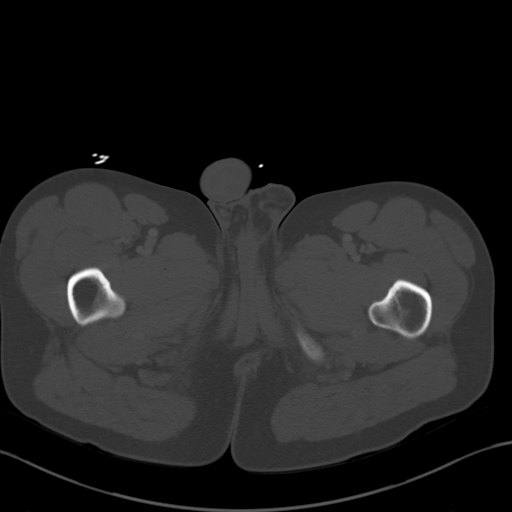
[im 13/99  soft-tissue]
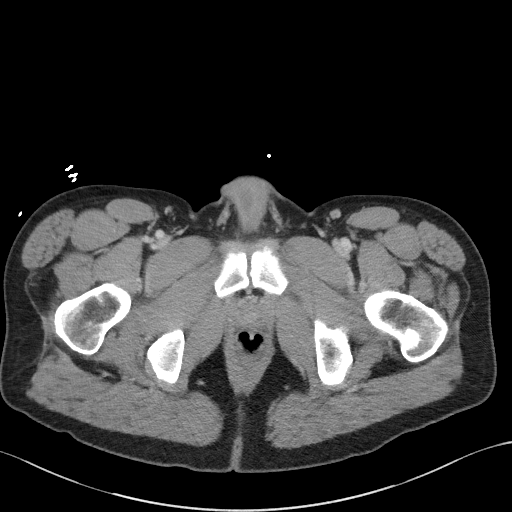
[im 21/99  soft-tissue]
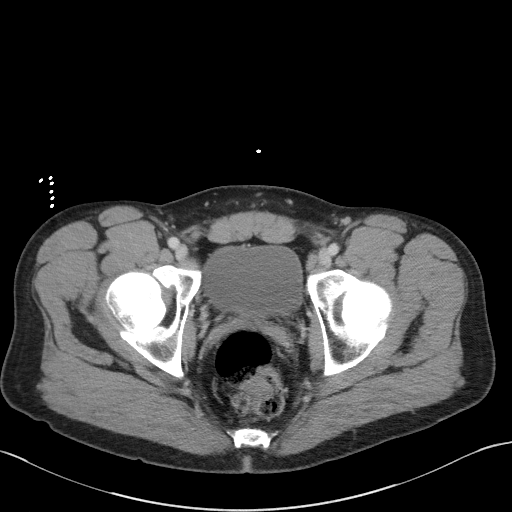
[im 29/99  soft-tissue]
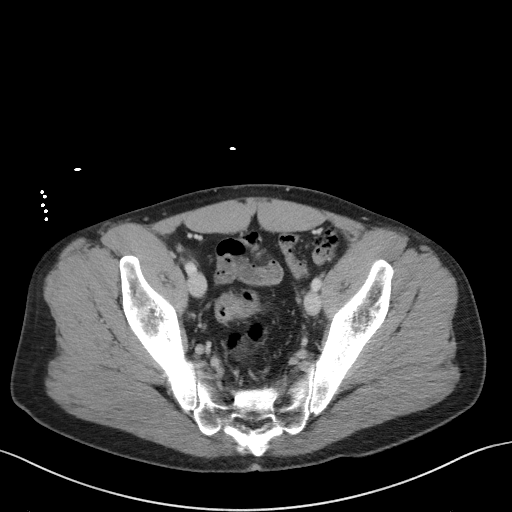
[im 33/99  soft-tissue]
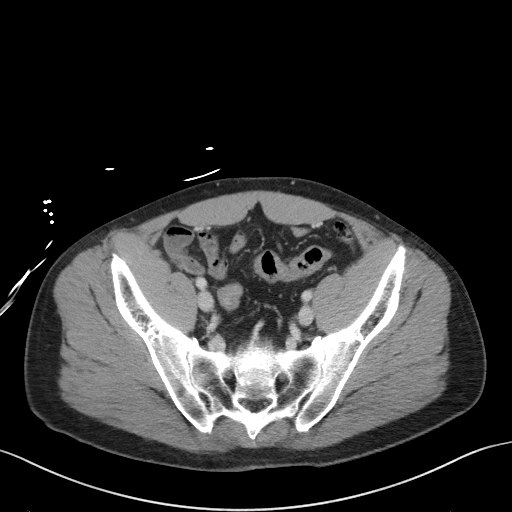
[im 41/99  soft-tissue]
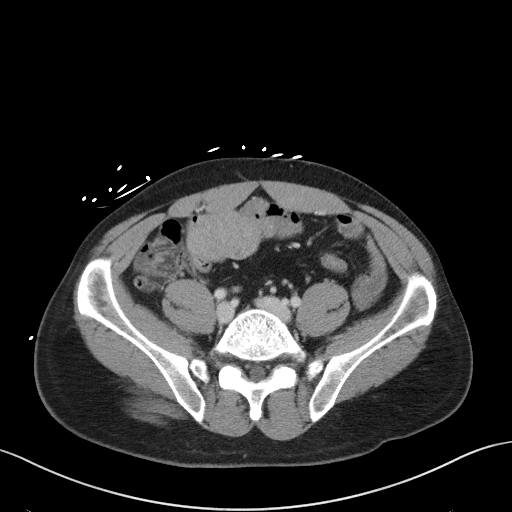
[im 50/99  soft-tissue]
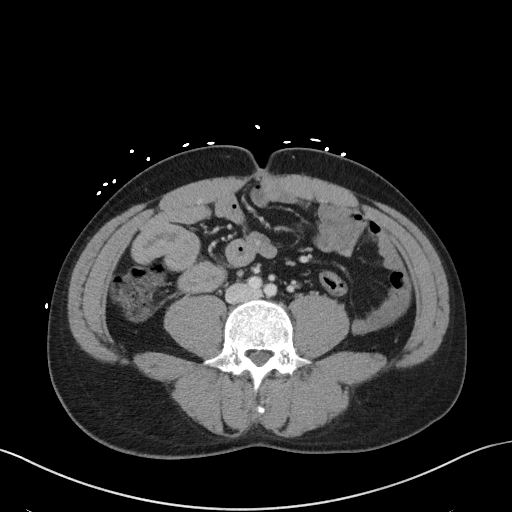
[im 58/99  soft-tissue]
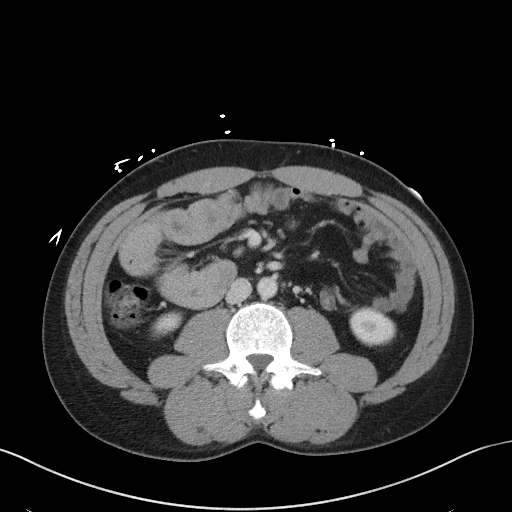
[im 66/99  soft-tissue]
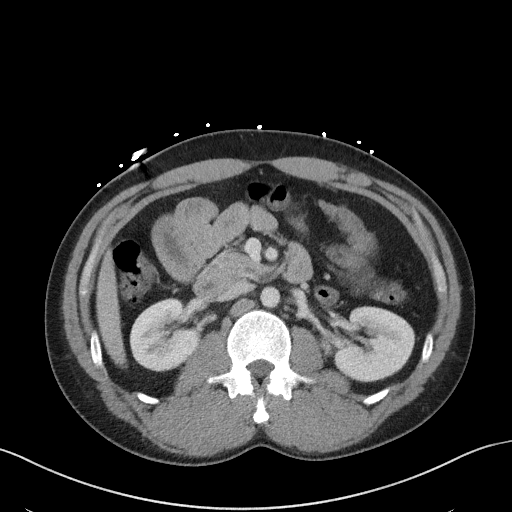
[im 66/99  bone]
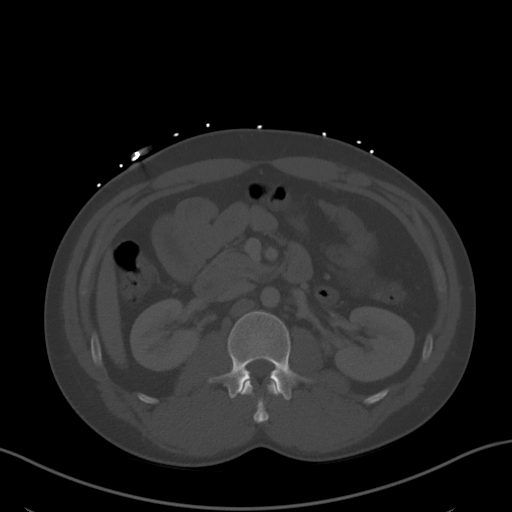
[im 70/99  soft-tissue]
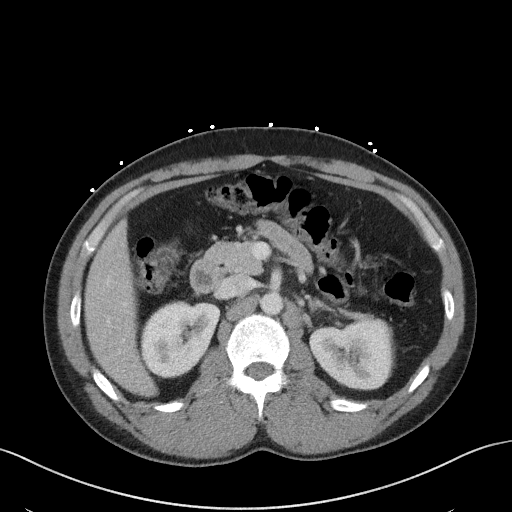
[im 78/99  soft-tissue]
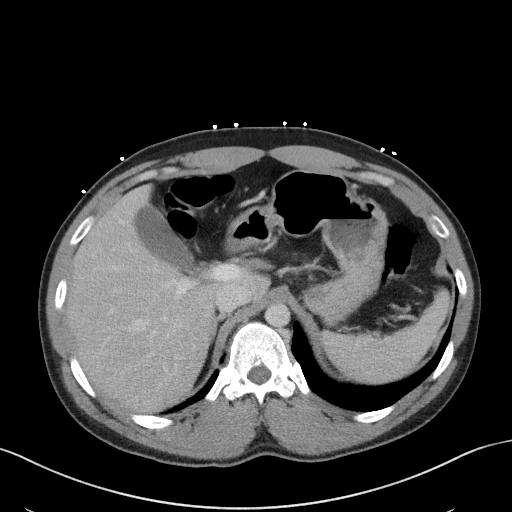
[im 86/99  soft-tissue]
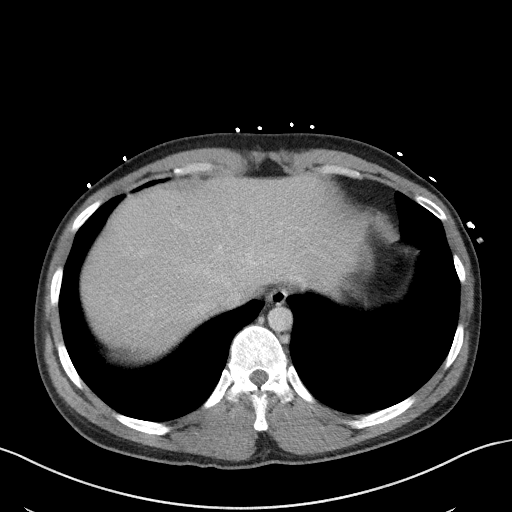
[im 94/99  soft-tissue]
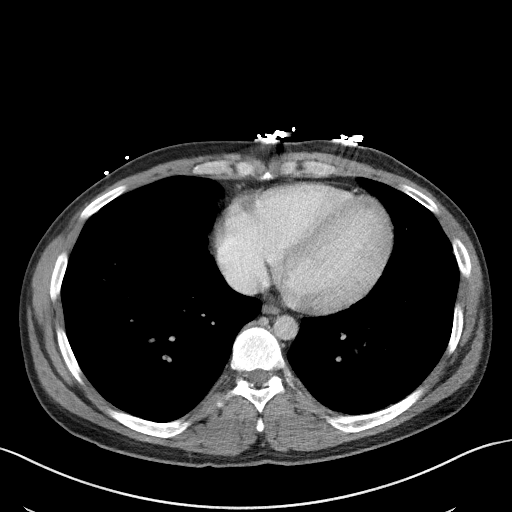

[Series 5: coronal st · coronal · 0.69mm/px · 3 of 86 slices shown]
[im 29/86  soft-tissue]
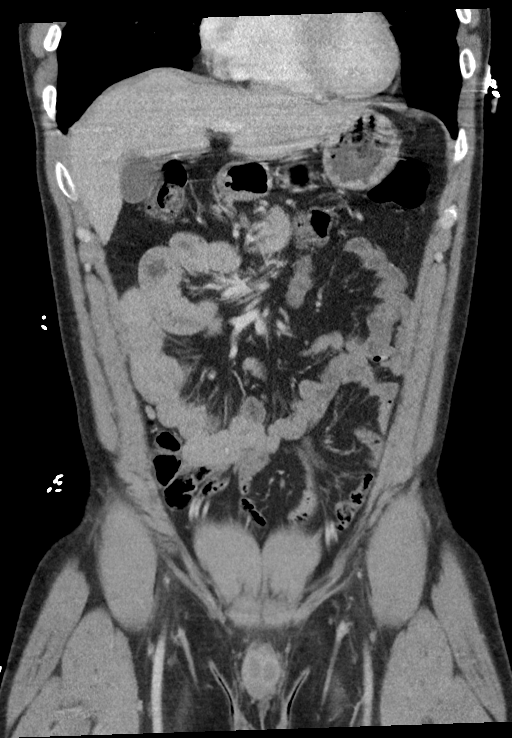
[im 38/86  soft-tissue]
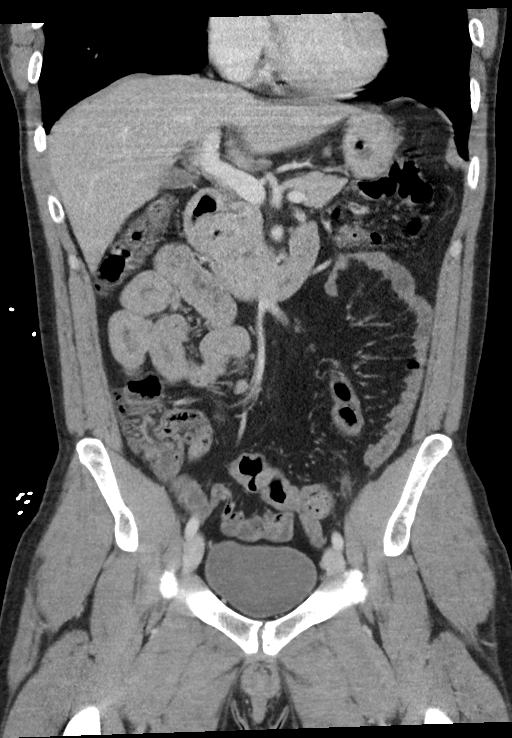
[im 48/86  soft-tissue]
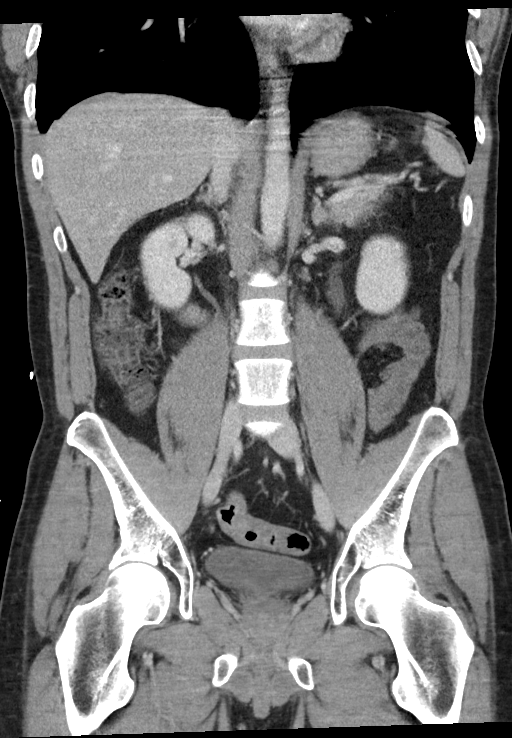

[16 of 46 positions shown; findings below may reference images not displayed]

FINDINGS: Lower chest: No acute abnormality.

Hepatobiliary: No focal liver abnormality is seen. No gallstones,
gallbladder wall thickening, or biliary dilatation.

Pancreas: Unremarkable. No pancreatic ductal dilatation or
surrounding inflammatory changes.

Spleen: Normal in size without focal abnormality.

Adrenals/Urinary Tract: Adrenal glands are unremarkable. Kidneys are
normal, without renal calculi, focal lesion, or hydronephrosis.
Bladder is unremarkable.

Stomach/Bowel: Stomach is within normal limits. Appendix appears
normal. There is no definite evidence of bowel obstruction. The
colon is nondilated. However, there appears to be severe wall
thickening involving the distal duodenum and jejunum concerning for
enteritis or infiltrative disorder such as lymphoma no abnormal
dilatation is noted.

Vascular/Lymphatic: No significant vascular findings are present. No
enlarged abdominal or pelvic lymph nodes.

Reproductive: Prostate is unremarkable.

Other: No abdominal wall hernia or abnormality. No abdominopelvic
ascites.

Musculoskeletal: No acute or significant osseous findings.
IMPRESSION: Severe wall thickening is seen involving the distal duodenum and
jejunum concerning for enteritis or infiltrative disorder such as
lymphoma.

## 2019-11-13 MED ORDER — LACTATED RINGERS IV BOLUS
1000.0000 mL | Freq: Once | INTRAVENOUS | Status: AC
  Administered 2019-11-13: 1000 mL via INTRAVENOUS

## 2019-11-13 MED ORDER — OMEPRAZOLE 20 MG PO CPDR: 20.0000 mg | DELAYED_RELEASE_CAPSULE | Freq: Two times a day (BID) | ORAL | 11 refills | Status: DC

## 2019-11-13 MED ORDER — ALUM & MAG HYDROXIDE-SIMETH 200-200-20 MG/5ML PO SUSP
30.0000 mL | Freq: Once | ORAL | Status: AC
  Administered 2019-11-13: 30 mL via ORAL
  Filled 2019-11-13: qty 30

## 2019-11-13 MED ORDER — PANTOPRAZOLE SODIUM 40 MG IV SOLR
40.0000 mg | Freq: Once | INTRAVENOUS | Status: AC
  Administered 2019-11-13: 40 mg via INTRAVENOUS
  Filled 2019-11-13: qty 40

## 2019-11-13 MED ORDER — IOHEXOL 300 MG/ML  SOLN
100.0000 mL | Freq: Once | INTRAMUSCULAR | Status: AC | PRN
  Administered 2019-11-13: 100 mL via INTRAVENOUS

## 2019-11-13 NOTE — ED Provider Notes (Addendum)
[facility] Emergency Department Provider Note  ____________________________________________   First MD Initiated Contact with Patient 11/13/19 1338     (approximate)  I have reviewed the triage vital signs and the nursing notes.   HISTORY  Chief Complaint Abdominal Pain and Rectal Bleeding   HPI [name] is a 29 y.o. male with past medical history of migraines who presents for assessment approximately 5 to 7 days of some general abdominal cramping and some bloating associated with nonbloody nonbilious emesis and some stools that have intermittently been very black.  No prior similar episodes.  No clear alleviating or aggravating factors.  Patient denies any headache, earache, sore throat, fevers, chills, cough, shortness of breath, chest pain, back pain, extremity pain, rash, burning with urination, blood in urine, or other acute complaints.  Denies NSAID use, recent EtOH use, illegal drug use, but does endorse tobacco abuse.         Past Medical History:  Diagnosis Date  . Migraine     There are no problems to display for this patient.   History reviewed. No pertinent surgical history.  Prior to Admission medications   Medication Sig Start Date End Date Taking? Authorizing Provider  cyclobenzaprine (FLEXERIL) 10 MG tablet Take 1 tablet (10 mg total) by mouth 3 (three) times daily as needed for muscle spasms. Patient not taking: Reported on 11/13/2019 02/02/18   Kem Boroughsriplett, Cari B, FNP  hydrocortisone 1 % ointment Apply 1 application topically 2 (two) times daily. Patient not taking: Reported on 11/13/2019 02/02/18   Kem Boroughsriplett, Cari B, FNP  isometheptene-acetaminophen-dichloralphenazone (MIDRIN) [phone] MG capsule Take 1 capsule by mouth every 4 (four) hours as needed for migraine. Maximum 5 capsules in 12 hours for migraine headaches, 8 capsules in 24 hours for tension headaches. 12/08/14 12/08/15  Tommi RumpsSummers, Rhonda L, PA-C  mupirocin ointment  (BACTROBAN) 2 % Apply a small amount to each nostril twice a day for 5 days to rid yourself of MRSA Patient not taking: Reported on 11/13/2019 02/11/18   Merrily Brittleifenbark, Neil, MD  naproxen (NAPROSYN) 500 MG tablet Take 1 tablet (500 mg total) by mouth 2 (two) times daily with a meal. Patient not taking: Reported on 11/13/2019 02/02/18   Chinita Pesterriplett, Cari B, FNP  omeprazole (PRILOSEC) 20 MG capsule Take 1 capsule (20 mg total) by mouth 2 (two) times daily. 11/13/19 11/12/20  Gilles ChiquitoSmith, Francisco Eyerly P, MD  ondansetron (ZOFRAN ODT) 4 MG disintegrating tablet Take 1 tablet (4 mg total) by mouth every 8 (eight) hours as needed for nausea or vomiting. Patient not taking: Reported on 11/13/2019 02/11/18   Merrily Brittleifenbark, Neil, MD  silver sulfADIAZINE (SILVADENE) 1 % cream Apply to affected area twice daily Patient not taking: Reported on 11/13/2019 02/07/18   Menshew, Charlesetta IvoryJenise V Bacon, PA-C  sulfamethoxazole-trimethoprim (BACTRIM DS,SEPTRA DS) 800-160 MG tablet Take 1 tablet by mouth 2 (two) times daily. Patient not taking: Reported on 11/13/2019 02/07/18   Menshew, Charlesetta IvoryJenise V Bacon, PA-C    Allergies Patient has no known allergies.  No family history on file.  Social History Social History   Tobacco Use  . Smoking status: Current Every Day Smoker    Packs/day: 0.25    Types: Cigarettes  . Smokeless tobacco: Never Used  Substance Use Topics  . Alcohol use: Yes    Comment: rarely  . Drug use: Yes    Types: Marijuana    Review of Systems  Review of Systems  Constitutional: Negative for chills and fever.  HENT: Negative for sore  throat.   Eyes: Negative for pain.  Respiratory: Negative for cough and stridor.   Cardiovascular: Negative for chest pain.  Gastrointestinal: Positive for abdominal pain, blood in stool and vomiting.  Skin: Negative for rash.  Neurological: Negative for seizures, loss of consciousness and headaches.  Psychiatric/Behavioral: Negative for suicidal ideas.  All other systems reviewed and are  negative.     ____________________________________________   PHYSICAL EXAM:  VITAL SIGNS: ED Triage Vitals  Enc Vitals Group     BP 11/13/19 1025 (!) 151/101     Pulse Rate 11/13/19 1025 93     Resp 11/13/19 1025 16     Temp 11/13/19 1025 98.1 F (36.7 C)     Temp Source 11/13/19 1025 Oral     SpO2 11/13/19 1025 100 %     Weight 11/13/19 1024 190 lb 0.6 oz (86.2 kg)     Height 11/13/19 1024 6' (1.829 m)     Head Circumference --      Peak Flow --      Pain Score 11/13/19 1024 8     Pain Loc --      Pain Edu? --      Excl. in GC? --    Vitals:   11/13/19 1345 11/13/19 1417  BP: (!) 160/92   Pulse: 79 73  Resp:  18  Temp:    SpO2: 100% 100%   Physical Exam Vitals and nursing note reviewed. Exam conducted with a chaperone present.  Constitutional:      Appearance: He is well-developed.  HENT:     Head: Normocephalic and atraumatic.     Right Ear: External ear normal.     Left Ear: External ear normal.     Nose: Nose normal.  Eyes:     Conjunctiva/sclera: Conjunctivae normal.  Cardiovascular:     Rate and Rhythm: Normal rate and regular rhythm.     Heart sounds: No murmur heard.   Pulmonary:     Effort: Pulmonary effort is normal. No respiratory distress.     Breath sounds: Normal breath sounds.  Abdominal:     Palpations: Abdomen is soft.     Tenderness: There is no abdominal tenderness. There is no right CVA tenderness or left CVA tenderness.  Genitourinary:    Rectum: Guaiac result positive. No anal fissure, external hemorrhoid or internal hemorrhoid.  Musculoskeletal:     Cervical back: Neck supple.  Skin:    General: Skin is warm and dry.     Capillary Refill: Capillary refill takes less than 2 seconds.  Neurological:     Mental Status: He is alert and oriented to person, place, and time.  Psychiatric:        Mood and Affect: Mood normal.      ____________________________________________   LABS (all labs ordered are listed, but only abnormal  results are displayed)  Labs Reviewed  COMPREHENSIVE METABOLIC PANEL - Abnormal; Notable for the following components:      Result Value   Glucose, Bld 116 (*)    Total Bilirubin 1.7 (*)    All other components within normal limits  CBC - Abnormal; Notable for the following components:   MCH 34.3 (*)    All other components within normal limits  GASTROINTESTINAL PANEL BY PCR, STOOL (REPLACES STOOL CULTURE)  LIPASE, BLOOD  URINALYSIS, COMPLETE (UACMP) WITH MICROSCOPIC  TYPE AND SCREEN   ____________________________________________ ____EKG MD interpretation  Sinus rhythm with a ventricular rate of 65, normal axis, unremarkable intervals, no evidence of  acute ischemia.  ________________________________________  RADIOLOGY   Official radiology report(s): CT ABDOMEN PELVIS W CONTRAST  Result Date: 11/13/2019 CLINICAL DATA:  Abdominal distension, vomiting. EXAM: CT ABDOMEN AND PELVIS WITH CONTRAST TECHNIQUE: Multidetector CT imaging of the abdomen and pelvis was performed using the standard protocol following bolus administration of intravenous contrast. CONTRAST:  OMNIPAQUE IOHEXOL 300 MG/ML  SOLN COMPARISON:  None. FINDINGS: Lower chest: No acute abnormality. Hepatobiliary: No focal liver abnormality is seen. No gallstones, gallbladder wall thickening, or biliary dilatation. Pancreas: Unremarkable. No pancreatic ductal dilatation or surrounding inflammatory changes. Spleen: Normal in size without focal abnormality. Adrenals/Urinary Tract: Adrenal glands are unremarkable. Kidneys are normal, without renal calculi, focal lesion, or hydronephrosis. Bladder is unremarkable. Stomach/Bowel: Stomach is within normal limits. Appendix appears normal. There is no definite evidence of bowel obstruction. The colon is nondilated. However, there appears to be severe wall thickening involving the distal duodenum and jejunum concerning for enteritis or infiltrative disorder such as lymphoma no abnormal  dilatation is noted. Vascular/Lymphatic: No significant vascular findings are present. No enlarged abdominal or pelvic lymph nodes. Reproductive: Prostate is unremarkable. Other: No abdominal wall hernia or abnormality. No abdominopelvic ascites. Musculoskeletal: No acute or significant osseous findings. IMPRESSION: Severe wall thickening is seen involving the distal duodenum and jejunum concerning for enteritis or infiltrative disorder such as lymphoma. Electronically Signed   By: Lupita Raider M.D.   On: 11/13/2019 14:25    ____________________________________________   PROCEDURES  Procedure(s) performed (including Critical Care):  .1-3 Lead EKG Interpretation Performed by: Gilles Chiquito, MD Authorized by: Gilles Chiquito, MD     Interpretation: normal     ECG rate assessment: normal     Rhythm: sinus rhythm     Ectopy: none     Conduction: normal       ____________________________________________   INITIAL IMPRESSION / ASSESSMENT AND PLAN / ED COURSE        Patient presents above to history exam for assessment of approximately 1 week of generalized crampy abdominal pain associate with some vomiting and some melanotic stools.  Patient is hypertensive with a BP of 160/92 with otherwise stable vital signs on arrival.  Exam as above remarkable abdomen is soft throughout but with guaiac positive brown stool.  Differential includes but is not limited to diverticulitis, infectious enteritis, autoimmune enteritis, pancreatitis, gastritis/PUD, cholecystitis, pyelonephritis, appendicitis, and other etiologies.  Low suspicion for ACS given patient denies any current chest pain has a reassuring EKG.  States the symptom has some epigastric and substernal burning after vomiting and this is likely secondary to some esophagitis.   No urinary symptoms, CVA tenderness, fever, or elevated white blood cell count suggest pyelonephritis.  CT abdomen pelvis does show evidence of enteritis  although degree of thickening was also concerning for possible lymphoma.  No evidence of appendicitis, diverticulitis, acute pancreatitis, cholecystitis, or other acute intra-abdominal pathology.  I did discuss these findings with on-call gastroenterologist Dr. Leory Plowman recommended close outpatient follow-up and Rx for Prilosec.  Rx written.  Patient given Protonix while in the ED.I did emphasize to the patient that I was unable to rule-out lymphoma and that it was critical that he follow-up with GI. Patient voiced understanding with this plan.   Given stable vital signs with otherwise reassuring work-up and exam I believe safe for discharge with plan for continued close outpatient work-up and follow-up.  Advised patient that his blood pressure is elevated and he should have this rechecked by his PCP.  Patient  discharged stable condition.  Strict return precautions advised and discussed.  ________________________________________   FINAL CLINICAL IMPRESSION(S) / ED DIAGNOSES  Final diagnoses:  Abdominal pain, unspecified abdominal location  Enteritis  Hypertension, unspecified type    Medications  lactated ringers bolus 1,000 mL (1,000 mLs Intravenous New Bag/Given 11/13/19 1355)  alum & mag hydroxide-simeth (MAALOX/MYLANTA) 200-200-20 MG/5ML suspension 30 mL (30 mLs Oral Given 11/13/19 1356)  pantoprazole (PROTONIX) injection 40 mg (40 mg Intravenous Given 11/13/19 1419)  iohexol (OMNIPAQUE) 300 MG/ML solution 100 mL (100 mLs Intravenous Contrast Given 11/13/19 1408)     ED Discharge Orders         Ordered    omeprazole (PRILOSEC) 20 MG capsule  2 times daily        11/13/19 1442           Note:  This document was prepared using Dragon voice recognition software and may include unintentional dictation errors.   Gilles Chiquito, MD 11/13/19 1447    Gilles Chiquito, MD 11/13/19 1910

## 2019-11-13 NOTE — ED Notes (Signed)
Pt taken to CT at this time.

## 2019-11-13 NOTE — ED Notes (Signed)
Pt given urinal and instructed on use ?

## 2019-11-13 NOTE — ED Triage Notes (Signed)
C/O abdominal bloating, vomiting and blood in stools x 1 week.  AAOx3.  Skin warm and dry. NAD

## 2019-11-28 ENCOUNTER — Other Ambulatory Visit: Payer: Self-pay

## 2019-11-28 ENCOUNTER — Encounter: Payer: Self-pay | Admitting: Gastroenterology

## 2019-11-28 ENCOUNTER — Ambulatory Visit (INDEPENDENT_AMBULATORY_CARE_PROVIDER_SITE_OTHER): Payer: Self-pay | Admitting: Gastroenterology

## 2019-11-28 VITALS — BP 150/101 | HR 94 | Temp 97.7°F | Ht 72.0 in | Wt 185.2 lb

## 2019-11-28 DIAGNOSIS — K921 Melena: Secondary | ICD-10-CM

## 2019-11-28 DIAGNOSIS — R109 Unspecified abdominal pain: Secondary | ICD-10-CM

## 2019-11-28 DIAGNOSIS — R935 Abnormal findings on diagnostic imaging of other abdominal regions, including retroperitoneum: Secondary | ICD-10-CM | POA: Insufficient documentation

## 2019-11-28 MED ORDER — OMEPRAZOLE 40 MG PO CPDR: 40.0000 mg | DELAYED_RELEASE_CAPSULE | Freq: Two times a day (BID) | ORAL | 3 refills | Status: AC

## 2019-11-28 NOTE — Progress Notes (Signed)
Wyline Mood MD, MRCP(U.K) [address]  [address]  [place], [place] [address]  Main: [phone]  Fax: [phone]   Gastroenterology Consultation  Referring Provider:     ER Primary Care Physician:  Patient, No Pcp Per Primary Gastroenterologist:  Dr. Wyline Mood  Reason for Consultation:     Abdominal pain        HPI:   [name] is a 29 y.o. y/o male referred from the emergency room for abdominal pain.  He presented to the ER on 11/13/2019 with abdominal pain and rectal bleeding.  He had some abdominal cramping bloating associated with nonbloody emesis. Some stools which were reported as being black in color.  At that point of time I do see that naproxen has been mentioned in his medication list.  In addition to it the patient had a CT scan of the abdomen and pelvis that demonstrated severe wall thickening in the distal duodenum and jejunum concerning for enteritis or infiltrative disorder such as lymphoma.  He had a CBC with a hemoglobin of 16.3 g.  CMP showing elevated glucose but a total bilirubin of 1.7 and normal LFTs.  Lipase was normal.  He was discharged to follow-up with me as an outpatient.  He states that he has had central abdominal pain for about a month at least if not longer.  Has been using BC powder at least 2 times a week for many months.  He does use Pepto-Bismol on a regular basis.  Has noticed black stools.  Denies any use of PPI.  Denies any weight loss.  Denies any illegal drug use.  Past Medical History:  Diagnosis Date   Migraine     No past surgical history on file.  Prior to Admission medications   Medication Sig Start Date End Date Taking? Authorizing Provider  cyclobenzaprine (FLEXERIL) 10 MG tablet Take 1 tablet (10 mg total) by mouth 3 (three) times daily as needed for muscle spasms. Patient not taking: Reported on 11/13/2019 02/02/18   Kem Boroughs B, FNP  hydrocortisone 1 % ointment Apply 1 application topically 2 (two) times  daily. Patient not taking: Reported on 11/13/2019 02/02/18   Kem Boroughs B, FNP  isometheptene-acetaminophen-dichloralphenazone (MIDRIN) [phone] MG capsule Take 1 capsule by mouth every 4 (four) hours as needed for migraine. Maximum 5 capsules in 12 hours for migraine headaches, 8 capsules in 24 hours for tension headaches. 12/08/14 12/08/15  Tommi Rumps, PA-C  mupirocin ointment (BACTROBAN) 2 % Apply a small amount to each nostril twice a day for 5 days to rid yourself of MRSA Patient not taking: Reported on 11/13/2019 02/11/18   Merrily Brittle, MD  naproxen (NAPROSYN) 500 MG tablet Take 1 tablet (500 mg total) by mouth 2 (two) times daily with a meal. Patient not taking: Reported on 11/13/2019 02/02/18   Chinita Pester, FNP  omeprazole (PRILOSEC) 20 MG capsule Take 1 capsule (20 mg total) by mouth 2 (two) times daily. 11/13/19 11/12/20  Gilles Chiquito, MD  ondansetron (ZOFRAN ODT) 4 MG disintegrating tablet Take 1 tablet (4 mg total) by mouth every 8 (eight) hours as needed for nausea or vomiting. Patient not taking: Reported on 11/13/2019 02/11/18   Merrily Brittle, MD  silver sulfADIAZINE (SILVADENE) 1 % cream Apply to affected area twice daily Patient not taking: Reported on 11/13/2019 02/07/18   Menshew, Charlesetta Ivory, PA-C  sulfamethoxazole-trimethoprim (BACTRIM DS,SEPTRA DS) 800-160 MG tablet Take 1 tablet by mouth 2 (two) times daily. Patient not  taking: Reported on 11/13/2019 02/07/18   Menshew, Charlesetta Ivory, PA-C    No family history on file.   Social History   Tobacco Use   Smoking status: Current Every Day Smoker    Packs/day: 0.25    Types: Cigarettes   Smokeless tobacco: Never Used  Substance Use Topics   Alcohol use: Yes    Comment: rarely   Drug use: Yes    Types: Marijuana    Allergies as of 11/28/2019   (No Known Allergies)    Review of Systems:    All systems reviewed and negative except where noted in HPI.   Physical Exam:  BP (!) 150/101  (BP Location: Right Arm, Patient Position: Sitting, Cuff Size: Normal)    Pulse 94    Temp 97.7 F (36.5 C) (Oral)    Ht 6' (1.829 m)    Wt 185 lb 3.2 oz (84 kg)    BMI 25.12 kg/m  No LMP for male patient. Psych:  Alert and cooperative. Normal mood and affect. General:   Alert,  Well-developed, well-nourished, pleasant and cooperative in NAD Head:  Normocephalic and atraumatic. Eyes:  Sclera clear, no icterus.   Conjunctiva pink. Ears:  Normal auditory acuity. Lungs:  Respirations even and unlabored.  Clear throughout to auscultation.   No wheezes, crackles, or rhonchi. No acute distress. Heart:  Regular rate and rhythm; no murmurs, clicks, rubs, or gallops. Abdomen:  Normal bowel sounds.  No bruits.  Soft, non-tender and non-distended without masses, hepatosplenomegaly or hernias noted.  No guarding or rebound tenderness.    Neurologic:  Alert and oriented x3;  grossly normal neurologically. Psych:  Alert and cooperative. Normal mood and affect.  Imaging Studies: CT ABDOMEN PELVIS W CONTRAST  Result Date: 11/13/2019 CLINICAL DATA:  Abdominal distension, vomiting. EXAM: CT ABDOMEN AND PELVIS WITH CONTRAST TECHNIQUE: Multidetector CT imaging of the abdomen and pelvis was performed using the standard protocol following bolus administration of intravenous contrast. CONTRAST:  OMNIPAQUE IOHEXOL 300 MG/ML  SOLN COMPARISON:  None. FINDINGS: Lower chest: No acute abnormality. Hepatobiliary: No focal liver abnormality is seen. No gallstones, gallbladder wall thickening, or biliary dilatation. Pancreas: Unremarkable. No pancreatic ductal dilatation or surrounding inflammatory changes. Spleen: Normal in size without focal abnormality. Adrenals/Urinary Tract: Adrenal glands are unremarkable. Kidneys are normal, without renal calculi, focal lesion, or hydronephrosis. Bladder is unremarkable. Stomach/Bowel: Stomach is within normal limits. Appendix appears normal. There is no definite evidence of bowel  obstruction. The colon is nondilated. However, there appears to be severe wall thickening involving the distal duodenum and jejunum concerning for enteritis or infiltrative disorder such as lymphoma no abnormal dilatation is noted. Vascular/Lymphatic: No significant vascular findings are present. No enlarged abdominal or pelvic lymph nodes. Reproductive: Prostate is unremarkable. Other: No abdominal wall hernia or abnormality. No abdominopelvic ascites. Musculoskeletal: No acute or significant osseous findings. IMPRESSION: Severe wall thickening is seen involving the distal duodenum and jejunum concerning for enteritis or infiltrative disorder such as lymphoma. Electronically Signed   By: Lupita Raider M.D.   On: 11/13/2019 14:25    Assessment and Plan:   [name] is a 29 y.o. y/o male has been referred for abdominal pain. Abnormal appearing duodenum which needs evaluation. Differentials include NSAID related vs H pylori related ulceration vs lymphoma .  Most likely related to NSAID use i.e. chronic BC use.  Black stools most likely related to use of Pepto-Bismol.  Denies any weight loss and hemoglobin is normal.  Plan  1. H pylori breath test  2. EGD in 3 to 4 weeks 3. Omeprazole 40 mg BID 4. No NSAID's-stop Pepto-Bismol as well as BC powder 5.  Elevated blood pressure.  Excess salt in diet.  Advised him to decrease salt intake and schedule appointment PCP to determine if he needs to start medications for hypertension.  I have discussed alternative options, risks & benefits,  which include, but are not limited to, bleeding, infection, perforation,respiratory complication & drug reaction.  The patient agrees with this plan & written consent will be obtained.     Follow up in 6-8 weeks   Dr Wyline Mood MD,MRCP(U.K)

## 2019-11-30 ENCOUNTER — Telehealth: Payer: Self-pay

## 2019-11-30 LAB — H. PYLORI BREATH TEST: H pylori Breath Test: POSITIVE — AB

## 2019-11-30 MED ORDER — CLARITHROMYCIN 500 MG PO TABS: 500.0000 mg | ORAL_TABLET | Freq: Two times a day (BID) | ORAL | 0 refills | Status: AC

## 2019-11-30 MED ORDER — AMOXICILLIN 500 MG PO TABS: 1000.0000 mg | ORAL_TABLET | Freq: Three times a day (TID) | ORAL | 0 refills | Status: AC

## 2019-11-30 NOTE — Telephone Encounter (Signed)
-----   Message from Wyline Mood, MD sent at 11/30/2019  8:41 AM EDT ----- Inform   H pylori gastritis.   Suggest clarithromycin 500 mg PO BID, amoxicillin 1 gram TID, omeprazole 20 mg BID all for 14 days.  , check for penicillin allergy and will need repeat H pylori stool antigen to check for eradication after .

## 2019-11-30 NOTE — Telephone Encounter (Signed)
Called pt to inform of results and Dr. Anna's recommendations.  Unable to contact, LVM to return call 

## 2019-11-30 NOTE — Telephone Encounter (Signed)
-----   Message from Kiran Anna, MD sent at 11/30/2019  8:41 AM EDT ----- Inform   H pylori gastritis.   Suggest clarithromycin 500 mg PO BID, amoxicillin 1 gram TID, omeprazole 20 mg BID all for 14 days.  , check for penicillin allergy and will need repeat H pylori stool antigen to check for eradication after .   

## 2019-11-30 NOTE — Telephone Encounter (Signed)
Pt has been notified of results and Dr. Anna's recommendations. 

## 2019-12-22 ENCOUNTER — Other Ambulatory Visit: Admission: RE | Admit: 2019-12-22 | Payer: Self-pay | Source: Ambulatory Visit

## 2019-12-26 ENCOUNTER — Ambulatory Visit: Admission: RE | Admit: 2019-12-26 | Payer: Self-pay | Source: Home / Self Care | Admitting: Gastroenterology

## 2019-12-26 ENCOUNTER — Encounter: Admission: RE | Payer: Self-pay | Source: Home / Self Care

## 2019-12-26 SURGERY — ESOPHAGOGASTRODUODENOSCOPY (EGD) WITH PROPOFOL
Anesthesia: General

## 2024-01-23 ENCOUNTER — Other Ambulatory Visit: Payer: Self-pay

## 2024-01-23 ENCOUNTER — Emergency Department: Payer: Self-pay

## 2024-01-23 ENCOUNTER — Emergency Department
Admission: EM | Admit: 2024-01-23 | Discharge: 2024-01-23 | Disposition: A | Discharge status: Home / Self Care | Payer: Self-pay | Attending: Emergency Medicine | Admitting: Emergency Medicine

## 2024-01-23 DIAGNOSIS — R0609 Other forms of dyspnea: Secondary | ICD-10-CM | POA: Insufficient documentation

## 2024-01-23 DIAGNOSIS — F17200 Nicotine dependence, unspecified, uncomplicated: Secondary | ICD-10-CM | POA: Insufficient documentation

## 2024-01-23 DIAGNOSIS — S335XXA Sprain of ligaments of lumbar spine, initial encounter: Secondary | ICD-10-CM

## 2024-01-23 DIAGNOSIS — S339XXA Sprain of unspecified parts of lumbar spine and pelvis, initial encounter: Secondary | ICD-10-CM | POA: Insufficient documentation

## 2024-01-23 DIAGNOSIS — X58XXXA Exposure to other specified factors, initial encounter: Secondary | ICD-10-CM | POA: Insufficient documentation

## 2024-01-23 LAB — CBC
HCT: 47.3 % (ref 39.0–52.0)
Hemoglobin: 16.5 g/dL (ref 13.0–17.0)
MCH: 34.5 pg — ABNORMAL HIGH (ref 26.0–34.0)
MCHC: 34.9 g/dL (ref 30.0–36.0)
MCV: 99 fL (ref 80.0–100.0)
Platelets: 269 K/uL (ref 150–400)
RBC: 4.78 MIL/uL (ref 4.22–5.81)
RDW: 14.5 % (ref 11.5–15.5)
WBC: 7.6 K/uL (ref 4.0–10.5)
nRBC: 0 % (ref 0.0–0.2)

## 2024-01-23 LAB — BASIC METABOLIC PANEL WITH GFR
Anion gap: 15 (ref 5–15)
BUN: 9 mg/dL (ref 6–20)
CO2: 23 mmol/L (ref 22–32)
Calcium: 9.9 mg/dL (ref 8.9–10.3)
Chloride: 100 mmol/L (ref 98–111)
Creatinine, Ser: 1.01 mg/dL (ref 0.61–1.24)
GFR, Estimated: >60 mL/min
Glucose, Bld: 105 mg/dL — ABNORMAL HIGH (ref 70–99)
Potassium: 4.2 mmol/L (ref 3.5–5.1)
Sodium: 138 mmol/L (ref 135–145)

## 2024-01-23 LAB — D-DIMER, QUANTITATIVE: D-Dimer, Quant: 0.37 ug{FEU}/mL (ref 0.00–0.50)

## 2024-01-23 MED ORDER — MELOXICAM 15 MG PO TABS: 15.0000 mg | ORAL_TABLET | Freq: Every day | ORAL | 0 refills | Status: AC

## 2024-01-23 MED ORDER — CYCLOBENZAPRINE HCL 10 MG PO TABS: 10.0000 mg | ORAL_TABLET | Freq: Three times a day (TID) | ORAL | 0 refills | Status: AC | PRN

## 2024-01-23 MED ORDER — LIDOCAINE 5 % EX PTCH: 1.0000 | MEDICATED_PATCH | CUTANEOUS | 0 refills | Status: AC

## 2024-01-23 NOTE — ED Provider Notes (Signed)
 "  [facility] Provider Note   Event Date/Time   First MD Initiated Contact with Patient 01/23/24 1126     (approximate) History  Shortness of Breath  HPI [name] is a 33 y.o. male with a past medical history of severe tobacco abuse at 1 pack/day who presents complaining of of left-sided back pain and shortness of breath.  Patient states that his left-sided back pain has been going on for approximately 5 weeks and has been waxing and waning in severity.  Patient states that he woke up with this pain in the left lateral upper paraspinal lumbar musculature that is worse with any movement and with palpation.  Patient states that since that time and has gotten somewhat better with over-the-counter ibuprofen  however it will always worsen again.  Patient denies any shooting pains from the back.  Patient describes shortness of breath over the last week as well that is worse with exertion.  Patient denies any history of asthma or COPD diagnosis.  Patient denies any recent travel or sick contacts.  Patient denies any prolonged periods of immobility. ROS: Patient currently denies any vision changes, tinnitus, difficulty speaking, facial droop, sore throat, chest pain, abdominal pain, nausea/vomiting/diarrhea, dysuria, or weakness/numbness/paresthesias in any extremity   Physical Exam  Triage Vital Signs: ED Triage Vitals  Encounter Vitals Group     BP 01/23/24 1032 (!) 140/102     Girls Systolic BP Percentile --      Girls Diastolic BP Percentile --      Boys Systolic BP Percentile --      Boys Diastolic BP Percentile --      Pulse Rate 01/23/24 1032 (!) 111     Resp 01/23/24 1032 19     Temp 01/23/24 1032 98.2 F (36.8 C)     Temp Source 01/23/24 1032 Oral     SpO2 01/23/24 1032 98 %     Weight 01/23/24 1033 200 lb (90.7 kg)     Height 01/23/24 1033 6' (1.829 m)     Head Circumference --      Peak Flow --      Pain Score 01/23/24 1033 8     Pain Loc --       Pain Education --      Exclude from Growth Chart --    Most recent vital signs: Vitals:   01/23/24 1032  BP: (!) 140/102  Pulse: (!) 111  Resp: 19  Temp: 98.2 F (36.8 C)  SpO2: 98%   General: Awake, oriented x4. CV:  Good peripheral perfusion. Resp:  Normal effort.  CTAB Abd:  No distention. Other:  Middle-aged well-developed, well-nourished African-American male resting comfortably in no acute distress.  Tenderness to palpation over the left upper lumbar paraspinal musculature ED Results / Procedures / Treatments  Labs (all labs ordered are listed, but only abnormal results are displayed) Labs Reviewed  BASIC METABOLIC PANEL WITH GFR - Abnormal; Notable for the following components:      Result Value   Glucose, Bld 105 (*)    All other components within normal limits  CBC - Abnormal; Notable for the following components:   MCH 34.5 (*)    All other components within normal limits  D-DIMER, QUANTITATIVE   EKG ED ECG REPORT I, Artist MARLA Kerns, the attending physician, personally viewed and interpreted this ECG. Date: 01/23/2024 EKG Time: 1032 Rate: 107 Rhythm: Tachycardic sinus rhythm QRS Axis: normal Intervals: normal ST/T Wave abnormalities: normal Narrative Interpretation: Tachycardic  sinus rhythm.  No evidence of acute ischemia RADIOLOGY ED MD interpretation: 2 view chest x-ray interpreted by me shows no evidence of acute abnormalities including no pneumonia, pneumothorax, or widened mediastinum - All radiology independently interpreted and agree with radiology assessment Official radiology report(s): DG Chest 2 View Result Date: 01/23/2024 CLINICAL DATA:  Shortness of breath. Left-sided back pain behind the scapula. EXAM: CHEST - 2 VIEW COMPARISON:  06/23/2017. FINDINGS: The heart size and mediastinal contours are within normal limits. Both lungs are clear. No pleural effusion or pneumothorax. The visualized skeletal structures are unremarkable. IMPRESSION: No  acute cardiopulmonary findings. Electronically Signed   By: Harrietta Sherry M.D.   On: 01/23/2024 12:12   PROCEDURES: Critical Care performed: No Procedures MEDICATIONS ORDERED IN ED: Medications - No data to display IMPRESSION / MDM / ASSESSMENT AND PLAN / ED COURSE  I reviewed the triage vital signs and the nursing notes.                             The patient is on the cardiac monitor to evaluate for evidence of arrhythmia and/or significant heart rate changes. Patient's presentation is most consistent with acute presentation with potential threat to life or bodily function. Patient is a 33 year old male who presents with multiple complaints including left-sided low back pain and shortness of breath with dyspnea on exertion. DDx: PE, COPD exacerbation, asthma exacerbation, herniated lumbar disc Plan: CBC, BMP, D-dimer, chest x-ray, EKG  Patient on examination, radiologic, and laboratory evaluation, patient shows no signs of acute abnormalities at this time.  Patient's D-dimer negative and therefore have low suspicion for PE at this time.  Patient's low back pain is not midline and has no associated bowel/bladder incontinence or sciatic pain.  Will treat patient empirically for lumbar strain as well as provide outpatient PCP referral for further workup of patient's dyspnea on exertion.  I have expressed concerns to patient that this may be the start of COPD related illness given his significant tobacco abuse patient expressed understanding and agrees with plan for discharge home at this time.  Patient given strict return precautions and all questions answered prior to discharge.  Dispo: Discharge home with PCP follow-up   FINAL CLINICAL IMPRESSION(S) / ED DIAGNOSES   Final diagnoses:  Lumbar back sprain, initial encounter  Dyspnea on exertion   Rx / DC Orders   ED Discharge Orders          Ordered    Ambulatory Referral to Primary Care (Establish Care)        01/23/24 1303     cyclobenzaprine  (FLEXERIL ) 10 MG tablet  3 times daily PRN        01/23/24 1303    meloxicam  (MOBIC ) 15 MG tablet  Daily        01/23/24 1303    lidocaine  (LIDODERM ) 5 %  Every 24 hours        01/23/24 1303           Note:  This document was prepared using Dragon voice recognition software and may include unintentional dictation errors.   Paige Vanderwoude K, MD 01/23/24 [phone]  "

## 2024-01-23 NOTE — ED Triage Notes (Signed)
 Pt c/o left back pain x 1 month. Also complains of shortness of breath x 2 weeks.
